# Patient Record
Sex: Male | Born: 1940 | Race: White | Hispanic: No | Marital: Married | Smoking: Never smoker
Health system: Southern US, Community
[De-identification: ages and names within clinical notes are randomized; demographics above are authoritative.]

## PROBLEM LIST (undated history)

## (undated) DIAGNOSIS — Z9889 Other specified postprocedural states: Secondary | ICD-10-CM

## (undated) DIAGNOSIS — N189 Chronic kidney disease, unspecified: Secondary | ICD-10-CM

## (undated) DIAGNOSIS — R001 Bradycardia, unspecified: Secondary | ICD-10-CM

## (undated) DIAGNOSIS — I2510 Atherosclerotic heart disease of native coronary artery without angina pectoris: Secondary | ICD-10-CM

## (undated) DIAGNOSIS — I10 Essential (primary) hypertension: Secondary | ICD-10-CM

## (undated) DIAGNOSIS — E669 Obesity, unspecified: Secondary | ICD-10-CM

## (undated) DIAGNOSIS — E785 Hyperlipidemia, unspecified: Secondary | ICD-10-CM

## (undated) DIAGNOSIS — I219 Acute myocardial infarction, unspecified: Secondary | ICD-10-CM

## (undated) DIAGNOSIS — Z9861 Coronary angioplasty status: Secondary | ICD-10-CM

## (undated) DIAGNOSIS — N289 Disorder of kidney and ureter, unspecified: Secondary | ICD-10-CM

## (undated) HISTORY — DX: Essential (primary) hypertension: I10

## (undated) HISTORY — PX: POLYPECTOMY: SHX149

## (undated) HISTORY — DX: Hyperlipidemia, unspecified: E78.5

## (undated) HISTORY — DX: Other specified postprocedural states: Z98.890

## (undated) HISTORY — DX: Disorder of kidney and ureter, unspecified: N28.9

## (undated) HISTORY — PX: CARDIAC CATHETERIZATION: SHX172

## (undated) HISTORY — DX: Chronic kidney disease, unspecified: N18.9

## (undated) HISTORY — DX: Acute myocardial infarction, unspecified: I21.9

## (undated) HISTORY — DX: Obesity, unspecified: E66.9

## (undated) HISTORY — DX: Bradycardia, unspecified: R00.1

## (undated) HISTORY — PX: COLONOSCOPY: SHX174

---

## 2000-04-06 ENCOUNTER — Emergency Department (HOSPITAL_COMMUNITY): Admission: EM | Admit: 2000-04-06 | Discharge: 2000-04-06 | Payer: Self-pay | Admitting: *Deleted

## 2002-04-08 DIAGNOSIS — I2510 Atherosclerotic heart disease of native coronary artery without angina pectoris: Secondary | ICD-10-CM

## 2002-04-08 HISTORY — DX: Atherosclerotic heart disease of native coronary artery without angina pectoris: I25.10

## 2002-04-08 HISTORY — PX: CARDIAC CATHETERIZATION: SHX172

## 2002-08-02 ENCOUNTER — Inpatient Hospital Stay (HOSPITAL_COMMUNITY): Admission: AD | Admit: 2002-08-02 | Discharge: 2002-08-09 | Payer: Self-pay | Admitting: Internal Medicine

## 2002-08-03 ENCOUNTER — Encounter: Payer: Self-pay | Admitting: Cardiovascular Disease

## 2002-08-03 ENCOUNTER — Encounter: Payer: Self-pay | Admitting: Internal Medicine

## 2002-08-04 ENCOUNTER — Encounter: Payer: Self-pay | Admitting: Internal Medicine

## 2002-08-05 ENCOUNTER — Encounter: Payer: Self-pay | Admitting: Internal Medicine

## 2004-04-24 ENCOUNTER — Ambulatory Visit: Payer: Self-pay | Admitting: Internal Medicine

## 2004-05-01 ENCOUNTER — Ambulatory Visit: Payer: Self-pay | Admitting: Internal Medicine

## 2004-10-31 ENCOUNTER — Ambulatory Visit: Payer: Self-pay | Admitting: Internal Medicine

## 2004-11-16 ENCOUNTER — Ambulatory Visit: Payer: Self-pay | Admitting: Internal Medicine

## 2005-02-11 ENCOUNTER — Ambulatory Visit: Payer: Self-pay | Admitting: Internal Medicine

## 2005-04-16 ENCOUNTER — Ambulatory Visit: Payer: Self-pay | Admitting: Internal Medicine

## 2005-04-23 ENCOUNTER — Ambulatory Visit: Payer: Self-pay | Admitting: Gastroenterology

## 2005-05-15 ENCOUNTER — Ambulatory Visit: Payer: Self-pay | Admitting: Gastroenterology

## 2005-05-15 ENCOUNTER — Encounter (INDEPENDENT_AMBULATORY_CARE_PROVIDER_SITE_OTHER): Payer: Self-pay | Admitting: Specialist

## 2005-05-15 DIAGNOSIS — Z9889 Other specified postprocedural states: Secondary | ICD-10-CM

## 2005-05-15 HISTORY — DX: Other specified postprocedural states: Z98.890

## 2005-05-15 LAB — HM COLONOSCOPY

## 2005-06-13 ENCOUNTER — Ambulatory Visit: Payer: Self-pay | Admitting: Internal Medicine

## 2005-08-14 ENCOUNTER — Ambulatory Visit: Payer: Self-pay | Admitting: Internal Medicine

## 2005-12-19 ENCOUNTER — Ambulatory Visit: Payer: Self-pay | Admitting: Internal Medicine

## 2005-12-24 ENCOUNTER — Ambulatory Visit: Payer: Self-pay | Admitting: Cardiology

## 2006-01-22 ENCOUNTER — Ambulatory Visit: Payer: Self-pay | Admitting: Internal Medicine

## 2006-03-20 ENCOUNTER — Ambulatory Visit: Payer: Self-pay | Admitting: Internal Medicine

## 2006-06-18 ENCOUNTER — Ambulatory Visit: Payer: Self-pay | Admitting: Internal Medicine

## 2006-06-18 LAB — CONVERTED CEMR LAB
ALT: 24 units/L (ref 0–40)
AST: 23 units/L (ref 0–37)
Albumin: 3.7 g/dL (ref 3.5–5.2)
Alkaline Phosphatase: 89 units/L (ref 39–117)
BUN: 22 mg/dL (ref 6–23)
Bilirubin, Direct: 0.2 mg/dL (ref 0.0–0.3)
CO2: 29 meq/L (ref 19–32)
Calcium: 9.2 mg/dL (ref 8.4–10.5)
Chloride: 105 meq/L (ref 96–112)
Cholesterol: 119 mg/dL (ref 0–200)
Creatinine, Ser: 1.7 mg/dL — ABNORMAL HIGH (ref 0.4–1.5)
GFR calc Af Amer: 52 mL/min
GFR calc non Af Amer: 43 mL/min
Glucose, Bld: 94 mg/dL (ref 70–99)
HDL: 27.4 mg/dL — ABNORMAL LOW (ref >39.0)
INR: 1 (ref 0.9–2.0)
LDL Cholesterol: 52 mg/dL (ref 0–99)
Potassium: 3.8 meq/L (ref 3.5–5.1)
Prothrombin Time: 12.6 s (ref 10.0–14.0)
Sodium: 143 meq/L (ref 135–145)
Total Bilirubin: 0.9 mg/dL (ref 0.3–1.2)
Total CHOL/HDL Ratio: 4.3
Total Protein: 7 g/dL (ref 6.0–8.3)
Triglycerides: 200 mg/dL — ABNORMAL HIGH (ref 0–149)
VLDL: 40 mg/dL (ref 0–40)
aPTT: 31.6 s (ref 26.5–36.5)

## 2006-10-06 ENCOUNTER — Ambulatory Visit: Payer: Self-pay | Admitting: Internal Medicine

## 2006-10-06 LAB — CONVERTED CEMR LAB
ALT: 22 units/L (ref 0–53)
AST: 19 units/L (ref 0–37)
Albumin: 4 g/dL (ref 3.5–5.2)
Alkaline Phosphatase: 90 units/L (ref 39–117)
BUN: 20 mg/dL (ref 6–23)
Bilirubin, Direct: 0.1 mg/dL (ref 0.0–0.3)
CO2: 25 meq/L (ref 19–32)
Calcium: 9.6 mg/dL (ref 8.4–10.5)
Chloride: 113 meq/L — ABNORMAL HIGH (ref 96–112)
Cholesterol: 102 mg/dL (ref 0–200)
Creatinine, Ser: 1.6 mg/dL — ABNORMAL HIGH (ref 0.4–1.5)
GFR calc Af Amer: 56 mL/min
GFR calc non Af Amer: 46 mL/min
Glucose, Bld: 98 mg/dL (ref 70–99)
HDL: 21.4 mg/dL — ABNORMAL LOW (ref >39.0)
LDL Cholesterol: 52 mg/dL (ref 0–99)
Potassium: 4.3 meq/L (ref 3.5–5.1)
Sodium: 143 meq/L (ref 135–145)
Total Bilirubin: 0.9 mg/dL (ref 0.3–1.2)
Total CHOL/HDL Ratio: 4.8
Total Protein: 7.4 g/dL (ref 6.0–8.3)
Triglycerides: 143 mg/dL (ref 0–149)
VLDL: 29 mg/dL (ref 0–40)

## 2006-10-28 DIAGNOSIS — E785 Hyperlipidemia, unspecified: Secondary | ICD-10-CM | POA: Insufficient documentation

## 2006-10-28 DIAGNOSIS — N259 Disorder resulting from impaired renal tubular function, unspecified: Secondary | ICD-10-CM | POA: Insufficient documentation

## 2006-10-28 DIAGNOSIS — I2510 Atherosclerotic heart disease of native coronary artery without angina pectoris: Secondary | ICD-10-CM

## 2006-10-28 DIAGNOSIS — I10 Essential (primary) hypertension: Secondary | ICD-10-CM

## 2007-01-01 ENCOUNTER — Ambulatory Visit: Payer: Self-pay | Admitting: Internal Medicine

## 2007-01-05 ENCOUNTER — Telehealth: Payer: Self-pay | Admitting: Internal Medicine

## 2007-02-03 ENCOUNTER — Ambulatory Visit: Payer: Self-pay | Admitting: Internal Medicine

## 2007-02-03 LAB — CONVERTED CEMR LAB
ALT: 32 units/L (ref 0–53)
AST: 23 units/L (ref 0–37)
Albumin: 4.2 g/dL (ref 3.5–5.2)
Alkaline Phosphatase: 90 units/L (ref 39–117)
BUN: 21 mg/dL (ref 6–23)
Bilirubin, Direct: 0.2 mg/dL (ref 0.0–0.3)
CO2: 26 meq/L (ref 19–32)
Calcium: 9.6 mg/dL (ref 8.4–10.5)
Chloride: 110 meq/L (ref 96–112)
Cholesterol: 120 mg/dL (ref 0–200)
Creatinine, Ser: 1.5 mg/dL (ref 0.4–1.5)
GFR calc Af Amer: 60 mL/min
GFR calc non Af Amer: 50 mL/min
Glucose, Bld: 95 mg/dL (ref 70–99)
HDL: 22.1 mg/dL — ABNORMAL LOW (ref >39.0)
LDL Cholesterol: 65 mg/dL (ref 0–99)
Potassium: 4.4 meq/L (ref 3.5–5.1)
Sodium: 141 meq/L (ref 135–145)
Total Bilirubin: 1.1 mg/dL (ref 0.3–1.2)
Total CHOL/HDL Ratio: 5.4
Total Protein: 7.2 g/dL (ref 6.0–8.3)
Triglycerides: 164 mg/dL — ABNORMAL HIGH (ref 0–149)
VLDL: 33 mg/dL (ref 0–40)

## 2007-06-03 ENCOUNTER — Ambulatory Visit: Payer: Self-pay | Admitting: Internal Medicine

## 2007-06-04 LAB — CONVERTED CEMR LAB
ALT: 35 units/L (ref 0–53)
Alkaline Phosphatase: 92 units/L (ref 39–117)
BUN: 20 mg/dL (ref 6–23)
Calcium: 9.8 mg/dL (ref 8.4–10.5)
GFR calc Af Amer: 46 mL/min
GFR calc non Af Amer: 38 mL/min
LDL Cholesterol: 63 mg/dL (ref 0–99)
Potassium: 4.7 meq/L (ref 3.5–5.1)
Total CHOL/HDL Ratio: 6
Total Protein: 6.8 g/dL (ref 6.0–8.3)
Triglycerides: 195 mg/dL — ABNORMAL HIGH (ref 0–149)
VLDL: 39 mg/dL (ref 0–40)

## 2007-10-05 ENCOUNTER — Ambulatory Visit: Payer: Self-pay | Admitting: Internal Medicine

## 2007-10-06 LAB — CONVERTED CEMR LAB
Albumin: 4 g/dL (ref 3.5–5.2)
Alkaline Phosphatase: 81 units/L (ref 39–117)
BUN: 22 mg/dL (ref 6–23)
Bilirubin, Direct: 0.1 mg/dL (ref 0.0–0.3)
Calcium: 9.1 mg/dL (ref 8.4–10.5)
GFR calc Af Amer: 60 mL/min
GFR calc non Af Amer: 50 mL/min
Glucose, Bld: 107 mg/dL — ABNORMAL HIGH (ref 70–99)
HDL: 23.2 mg/dL — ABNORMAL LOW (ref >39.0)
Potassium: 4.4 meq/L (ref 3.5–5.1)
Sodium: 141 meq/L (ref 135–145)
Total Protein: 7.4 g/dL (ref 6.0–8.3)
Triglycerides: 207 mg/dL (ref 0–149)
VLDL: 41 mg/dL — ABNORMAL HIGH (ref 0–40)

## 2007-12-16 ENCOUNTER — Ambulatory Visit: Payer: Self-pay | Admitting: Internal Medicine

## 2008-02-03 ENCOUNTER — Ambulatory Visit: Payer: Self-pay | Admitting: Internal Medicine

## 2008-05-20 ENCOUNTER — Telehealth: Payer: Self-pay | Admitting: Internal Medicine

## 2008-05-23 ENCOUNTER — Encounter: Payer: Self-pay | Admitting: Internal Medicine

## 2008-05-27 ENCOUNTER — Encounter: Payer: Self-pay | Admitting: Internal Medicine

## 2008-05-30 ENCOUNTER — Ambulatory Visit: Payer: Self-pay | Admitting: Internal Medicine

## 2008-06-01 LAB — CONVERTED CEMR LAB
ALT: 30 units/L (ref 0–53)
AST: 23 units/L (ref 0–37)
Albumin: 4 g/dL (ref 3.5–5.2)
BUN: 20 mg/dL (ref 6–23)
CO2: 27 meq/L (ref 19–32)
Calcium: 9.4 mg/dL (ref 8.4–10.5)
Chloride: 107 meq/L (ref 96–112)
Cholesterol: 115 mg/dL (ref 0–200)
Creatinine, Ser: 1.7 mg/dL — ABNORMAL HIGH (ref 0.4–1.5)
HDL: 26.3 mg/dL — ABNORMAL LOW (ref >39.0)
TSH: 5.24 microintl units/mL (ref 0.35–5.50)
Total CHOL/HDL Ratio: 4.4
Total Protein: 7 g/dL (ref 6.0–8.3)
Triglycerides: 175 mg/dL — ABNORMAL HIGH (ref 0–149)

## 2008-08-01 ENCOUNTER — Ambulatory Visit: Payer: Self-pay | Admitting: Internal Medicine

## 2008-09-13 ENCOUNTER — Ambulatory Visit: Payer: Self-pay | Admitting: Internal Medicine

## 2009-01-18 ENCOUNTER — Ambulatory Visit: Payer: Self-pay | Admitting: Internal Medicine

## 2009-01-18 LAB — CONVERTED CEMR LAB
ALT: 31 units/L (ref 0–53)
AST: 23 units/L (ref 0–37)
Alkaline Phosphatase: 72 units/L (ref 39–117)
BUN: 25 mg/dL — ABNORMAL HIGH (ref 6–23)
Bilirubin, Direct: 0 mg/dL (ref 0.0–0.3)
Cholesterol: 118 mg/dL (ref 0–200)
Creatinine, Ser: 1.7 mg/dL — ABNORMAL HIGH (ref 0.4–1.5)
GFR calc non Af Amer: 42.84 mL/min (ref >60)
Glucose, Bld: 89 mg/dL (ref 70–99)
Potassium: 4.4 meq/L (ref 3.5–5.1)
Total Bilirubin: 1.2 mg/dL (ref 0.3–1.2)
Total Protein: 7 g/dL (ref 6.0–8.3)
VLDL: 31.2 mg/dL (ref 0.0–40.0)

## 2009-01-25 ENCOUNTER — Ambulatory Visit: Payer: Self-pay | Admitting: Internal Medicine

## 2009-01-25 DIAGNOSIS — E669 Obesity, unspecified: Secondary | ICD-10-CM

## 2009-05-31 ENCOUNTER — Ambulatory Visit: Payer: Self-pay | Admitting: Internal Medicine

## 2009-05-31 LAB — CONVERTED CEMR LAB
ALT: 24 units/L (ref 0–53)
Alkaline Phosphatase: 71 units/L (ref 39–117)
BUN: 21 mg/dL (ref 6–23)
Basophils Absolute: 0 10*3/uL (ref 0.0–0.1)
Bilirubin, Direct: 0.1 mg/dL (ref 0.0–0.3)
CO2: 25 meq/L (ref 19–32)
Calcium: 9 mg/dL (ref 8.4–10.5)
Eosinophils Absolute: 0.5 10*3/uL (ref 0.0–0.7)
Glucose, Bld: 111 mg/dL — ABNORMAL HIGH (ref 70–99)
HCT: 44.3 % (ref 39.0–52.0)
Hemoglobin: 14.9 g/dL (ref 13.0–17.0)
Lymphs Abs: 1.5 10*3/uL (ref 0.7–4.0)
MCHC: 33.6 g/dL (ref 30.0–36.0)
Neutro Abs: 5 10*3/uL (ref 1.4–7.7)
Potassium: 4.2 meq/L (ref 3.5–5.1)
RDW: 12.6 % (ref 11.5–14.6)
Sodium: 140 meq/L (ref 135–145)
Total Bilirubin: 0.6 mg/dL (ref 0.3–1.2)
VLDL: 36 mg/dL (ref 0.0–40.0)

## 2009-07-20 ENCOUNTER — Ambulatory Visit: Payer: Self-pay | Admitting: Internal Medicine

## 2009-09-27 ENCOUNTER — Ambulatory Visit: Payer: Self-pay | Admitting: Internal Medicine

## 2010-01-01 ENCOUNTER — Ambulatory Visit: Payer: Self-pay | Admitting: Internal Medicine

## 2010-01-04 ENCOUNTER — Telehealth: Payer: Self-pay | Admitting: Internal Medicine

## 2010-04-16 ENCOUNTER — Ambulatory Visit
Admission: RE | Admit: 2010-04-16 | Discharge: 2010-04-16 | Payer: Self-pay | Source: Home / Self Care | Attending: Internal Medicine | Admitting: Internal Medicine

## 2010-04-16 ENCOUNTER — Other Ambulatory Visit: Payer: Self-pay | Admitting: Internal Medicine

## 2010-04-16 LAB — HEPATIC FUNCTION PANEL
ALT: 22 U/L (ref 0–53)
AST: 19 U/L (ref 0–37)
Albumin: 3.8 g/dL (ref 3.5–5.2)
Alkaline Phosphatase: 80 U/L (ref 39–117)
Bilirubin, Direct: 0.1 mg/dL (ref 0.0–0.3)
Total Bilirubin: 0.7 mg/dL (ref 0.3–1.2)
Total Protein: 6.7 g/dL (ref 6.0–8.3)

## 2010-04-16 LAB — LIPID PANEL
Cholesterol: 132 mg/dL (ref 0–200)
HDL: 27.7 mg/dL — ABNORMAL LOW (ref >39.00)
LDL Cholesterol: 69 mg/dL (ref 0–99)
Total CHOL/HDL Ratio: 5
Triglycerides: 179 mg/dL — ABNORMAL HIGH (ref 0.0–149.0)
VLDL: 35.8 mg/dL (ref 0.0–40.0)

## 2010-04-16 LAB — BASIC METABOLIC PANEL
BUN: 26 mg/dL — ABNORMAL HIGH (ref 6–23)
CO2: 25 mEq/L (ref 19–32)
Calcium: 9.4 mg/dL (ref 8.4–10.5)
Chloride: 109 mEq/L (ref 96–112)
Creatinine, Ser: 1.6 mg/dL — ABNORMAL HIGH (ref 0.4–1.5)
GFR: 45.13 mL/min — ABNORMAL LOW (ref >60.00)
Glucose, Bld: 97 mg/dL (ref 70–99)
Potassium: 4.6 mEq/L (ref 3.5–5.1)
Sodium: 142 mEq/L (ref 135–145)

## 2010-04-25 ENCOUNTER — Ambulatory Visit
Admission: RE | Admit: 2010-04-25 | Discharge: 2010-04-25 | Payer: Self-pay | Source: Home / Self Care | Attending: Internal Medicine | Admitting: Internal Medicine

## 2010-05-08 NOTE — Assessment & Plan Note (Signed)
Summary: 3 month f/u//alp   Vital Signs:  Patient profile:   70 year old male Weight:      240 pounds Temp:     98.4 degrees F oral Pulse rate:   72 / minute Pulse rhythm:   regular BP sitting:   144 / 86  Vitals Entered By: Lynann Beaver CMA (January 01, 2010 9:01 AM)  CC: rov Is Patient Diabetic? No Pain Assessment Patient in pain? no        Primary Care Dara Camargo:  Birdie Sons MD  CC:  rov.  History of Present Illness:  Follow-Up Visit      This is a 70 year old man who presents for Follow-up visit.  The patient denies chest pain and palpitations.  Since the last visit the patient notes no new problems or concerns.  The patient reports taking meds as prescribed.  When questioned about possible medication side effects, the patient notes none.    All other systems reviewed and were negative   Current Problems (verified): 1)  Obesity  (ICD-278.00) 2)  Hyperlipidemia  (ICD-272.4) 3)  Renal Insufficiency  (ICD-588.9) 4)  Hypertension  (ICD-401.9) 5)  Coronary Artery Disease  (ICD-414.00)  Current Medications (verified): 1)  Benicar 40 Mg  Tabs (Olmesartan Medoxomil) .Marland Kitchen.. 1 By Mouth Once Daily 2)  Ecotrin 325 Mg Tbec (Aspirin) .... Take 1 Tablet Once A Day 3)  Amlodipine Besylate 10 Mg Tabs (Amlodipine Besylate) .... Take 1 Tablet By Mouth Once A Day 4)  Simvastatin 40 Mg Tabs (Simvastatin) .... Take 1 Tablet By Mouth At Bedtime 5)  Niacin 250 Mg  Tabs (Niacin) .... 4 Daily 6)  Calcium 600/vitamin D 600-200 Mg-Unit  Tabs (Calcium Carbonate-Vitamin D) .... One Once Daily 7)  Fish Oil   Oil (Fish Oil) .... 1000mg  2 Daily 8)  Antioxidant Capsule .... Once Daily  Allergies (verified): 1)  ! Atenolol  Past History:  Past Medical History: Last updated: 07/20/2009 1. Coronary artery disease    --s/p MI    --s/p stents LCX and prox LAD 2004 2. Hypertension 3. Renal insufficiency 4. Hyperlipidemia 5. Obesity 6. Bradycardia requiring discontinuation of  b-blocker  Past Surgical History: Last updated: 10/28/2006 Colonoscopy-05/15/2005 Cardiac Cath  Social History: Last updated: 10/28/2006 Never Smoked Married Retired  Risk Factors: Smoking Status: never (09/27/2009)  Physical Exam  General:  alert and well-developed.   Head:  normocephalic and atraumatic.   Eyes:  pupils equal and pupils round.   Neck:  No deformities, masses, or tenderness noted. Chest Wall:  No deformities, masses, tenderness or gynecomastia noted. Lungs:  Normal respiratory effort, chest expands symmetrically. Lungs are clear to auscultation, no crackles or wheezes. Heart:  normal rate and regular rhythm.   Abdomen:  soft and non-tender.   Msk:  No deformity or scoliosis noted of thoracic or lumbar spine.   Neurologic:  cranial nerves II-XII intact and strength normal in all extremities.   Skin:  turgor normal and color normal.     Impression & Recommendations:  Problem # 1:  HYPERLIPIDEMIA (ICD-272.4) tolerating meds controlled continue current medications  His updated medication list for this problem includes:    Simvastatin 40 Mg Tabs (Simvastatin) .Marland Kitchen... Take 1 tablet by mouth at bedtime    Niacin 250 Mg Tabs (Niacin) .Marland KitchenMarland KitchenMarland KitchenMarland Kitchen 4 daily  Labs Reviewed: SGOT: 21 (05/31/2009)   SGPT: 24 (05/31/2009)   HDL:28.50 (05/31/2009), 25.20 (01/18/2009)  LDL:48 (05/31/2009), 62 (01/18/2009)  Chol:112 (05/31/2009), 118 (01/18/2009)  Trig:180.0 (05/31/2009), 156.0 (01/18/2009)  Problem #  2:  HYPERTENSION (ICD-401.9) tolerating meds continue current medications ---repeat BP ok His updated medication list for this problem includes:    Benicar 40 Mg Tabs (Olmesartan medoxomil) .Marland Kitchen... 1 by mouth once daily    Amlodipine Besylate 10 Mg Tabs (Amlodipine besylate) .Marland Kitchen... Take 1 tablet by mouth once a day  BP today: 144/86 Prior BP: 132/70 (09/27/2009)  Labs Reviewed: K+: 4.2 (05/31/2009) Creat: : 1.5 (05/31/2009)   Chol: 112 (05/31/2009)   HDL: 28.50 (05/31/2009)    LDL: 48 (05/31/2009)   TG: 180.0 (05/31/2009)  Problem # 3:  CORONARY ARTERY DISEASE (ICD-414.00) no sxs continue current medications  His updated medication list for this problem includes:    Benicar 40 Mg Tabs (Olmesartan medoxomil) .Marland Kitchen... 1 by mouth once daily    Ecotrin 325 Mg Tbec (Aspirin) .Marland Kitchen... Take 1 tablet once a day    Amlodipine Besylate 10 Mg Tabs (Amlodipine besylate) .Marland Kitchen... Take 1 tablet by mouth once a day  Labs Reviewed: Chol: 112 (05/31/2009)   HDL: 28.50 (05/31/2009)   LDL: 48 (05/31/2009)   TG: 180.0 (05/31/2009)  Complete Medication List: 1)  Benicar 40 Mg Tabs (Olmesartan medoxomil) .Marland Kitchen.. 1 by mouth once daily 2)  Ecotrin 325 Mg Tbec (Aspirin) .... Take 1 tablet once a day 3)  Amlodipine Besylate 10 Mg Tabs (Amlodipine besylate) .... Take 1 tablet by mouth once a day 4)  Simvastatin 40 Mg Tabs (Simvastatin) .... Take 1 tablet by mouth at bedtime 5)  Niacin 250 Mg Tabs (Niacin) .... 4 daily 6)  Calcium 600/vitamin D 600-200 Mg-unit Tabs (Calcium carbonate-vitamin d) .... One once daily 7)  Fish Oil Oil (Fish oil) .... 1000mg  2 daily 8)  Antioxidant Capsule  .... Once daily  Other Orders: Flu Vaccine 37yrs + MEDICARE PATIENTS (Q0347) Administration Flu vaccine - MCR (Q2595)  Patient Instructions: 1)  Please schedule a follow-up appointment in 4 months. 2)  lipids 272.4 3)  liver 995.2 4)  bmet--995.2 Flu Vaccine Consent Questions     Do you have a history of severe allergic reactions to this vaccine? no    Any prior history of allergic reactions to egg and/or gelatin? no    Do you have a sensitivity to the preservative Thimersol? no    Do you have a past history of Guillan-Barre Syndrome? no    Do you currently have an acute febrile illness? no    Have you ever had a severe reaction to latex? no    Vaccine information given and explained to patient? yes    Are you currently pregnant? no    Lot Number:[id number]   Exp Date:10/06/2010   Site Given  Left Deltoid  IMlu

## 2010-05-08 NOTE — Assessment & Plan Note (Signed)
Summary: f1y per check out/lg  Medications Added FISH OIL   OIL (FISH OIL) 1000mg  2 daily      Allergies Added:   Visit Type:  Follow-up Primary Provider:  Birdie Sons MD  CC:  no complaints.  History of Present Illness: Shaul is a very pleasant 70 year old male with a history of coronary artery disease status post lateral wall myocardial infarction in April 2004.  He underwent stenting of the circumflex and proximal LAD by Dr. Samule Ohm.  He also has a history of hypertension, hyperlipidemia, and obesity.  He returns today for his yearly followup.  Prevuiously had to stop his b-blocker due to bradycardia with HRs in the 40s.  Overall doing very well. No CP or SOB. Active around garden. Still not exercising although we have recommended walking program in past. Able to push mow his lawn. Weight stable. BP in general, well controlled. Toelrating medications without problem.  Last cholesterol panel 2/11; TC 112 TG 180  HDL 29 LDL 48. Not taking fish oil every day. Taking 1g niacin daily.   Current Medications (verified): 1)  Benicar 40 Mg  Tabs (Olmesartan Medoxomil) .Marland Kitchen.. 1 By Mouth Once Daily 2)  Ecotrin 325 Mg Tbec (Aspirin) .... Take 1 Tablet Once A Day 3)  Amlodipine Besylate 10 Mg Tabs (Amlodipine Besylate) .... Take 1 Tablet By Mouth Once A Day 4)  Plavix 75 Mg Tabs (Clopidogrel Bisulfate) .... Take 1 Tablet By Mouth Once A Day 5)  Simvastatin 40 Mg Tabs (Simvastatin) .... Take 1 Tablet By Mouth At Bedtime 6)  Niacin 250 Mg  Tabs (Niacin) .... 4 Daily 7)  Calcium 600/vitamin D 600-200 Mg-Unit  Tabs (Calcium Carbonate-Vitamin D) .... One Once Daily 8)  Fish Oil   Oil (Fish Oil) .... 1000mg  2 Daily  Allergies (verified): 1)  ! Atenolol  Past History:  Past Medical History: 1. Coronary artery disease    --s/p MI    --s/p stents LCX and prox LAD 2004 2. Hypertension 3. Renal insufficiency 4. Hyperlipidemia 5. Obesity 6. Bradycardia requiring discontinuation of  b-blocker  Review of Systems       As per HPI and past medical history; otherwise all systems negative.   Vital Signs:  Patient profile:   70 year old male Height:      72 inches Weight:      243 pounds BMI:     33.08 Pulse rate:   68 / minute BP sitting:   114 / 78  (left arm) Cuff size:   regular  Vitals Entered By: Hardin Negus, RMA (July 20, 2009 9:20 AM)  Physical Exam  General:  Gen: well appearing. no resp difficulty HEENT: normal Neck: supple. no JVD. Carotids 2+ bilat; no bruits. No lymphadenopathy or thryomegaly appreciated. Cor: PMI nondisplaced. Regular rate & rhythm. No rubs, gallops, murmur. Lungs: clear Abdomen: obese. soft, nontender, nondistended. No hepatosplenomegaly. Extremities: no cyanosis, clubbing, rash, edema Neuro: alert & orientedx3, cranial nerves grossly intact. moves all 4 extremities w/o difficulty. affect pleasant    Impression & Recommendations:  Problem # 1:  CORONARY ARTERY DISEASE (ICD-414.00) Stable. No evidence of ischemia. Continue current regimen. We discussed possibility of stopping Plavix but as it is going generic in November he would like to continue. Recommneded walking program 20-30 mins 4 to 5 times per week.  Problem # 2:  OBESITY (ICD-278.00) Start exercise program as above.  Problem # 3:  HYPERTENSION (ICD-401.9) Blood pressure well controlled. Continue current regimen.  Problem # 4:  HYPERLIPIDEMIA (ICD-272.4)  LDL looks great. HDL still low. Increase fish oil to 3g per day. Ecouraged weight loss.  Other Orders: EKG w/ Interpretation (93000)  Patient Instructions: 1)  Increase Fish Oil to 3 grams a day 2)  Your physician discussed the importance of regular exercise and recommended that you start or continue a regular exercise program for good health. 3)  Follow up in 1 year Prescriptions: PLAVIX 75 MG TABS (CLOPIDOGREL BISULFATE) Take 1 tablet by mouth once a day Brand medically necessary #8 x 0   Entered by:    Meredith Staggers, RN   Authorized by:   Dolores Patty, MD, [facility]   Signed by:   Meredith Staggers, RN on 07/20/2009   Method used:   Samples Given   RxID:   [id number]

## 2010-05-08 NOTE — Assessment & Plan Note (Signed)
Summary: 4 month rov/njr   Vital Signs:  Patient profile:   70 year old male Weight:      244 pounds BMI:     33.21 Temp:     98.2 degrees F oral Pulse rate:   70 / minute Resp:     12 per minute BP sitting:   128 / 76  (left arm) Cuff size:   regular  Vitals Entered By: Gladis Riffle, RN (May 31, 2009 9:16 AM) CC: f/u CAD, HTN Is Patient Diabetic? No   Primary Care Provider:  Birdie Sons MD  CC:  f/u CAD and HTN.  History of Present Illness:  Follow-Up Visit      This is a 70 year old man who presents for Follow-up visit.  The patient denies chest pain and palpitations.  Since the last visit the patient notes no new problems or concerns.  The patient reports taking meds as prescribed.  When questioned about possible medication side effects, the patient notes none.    All other systems reviewed and were negative   Preventive Screening-Counseling & Management  Alcohol-Tobacco     Smoking Status: never  Current Problems (verified): 1)  Obesity  (ICD-278.00) 2)  Hyperlipidemia  (ICD-272.4) 3)  Renal Insufficiency  (ICD-588.9) 4)  Hypertension  (ICD-401.9) 5)  Coronary Artery Disease  (ICD-414.00)  Medications Prior to Update: 1)  Benicar 40 Mg  Tabs (Olmesartan Medoxomil) .Marland Kitchen.. 1 By Mouth Once Daily 2)  Ecotrin 325 Mg Tbec (Aspirin) .... Take 1 Tablet Once A Day 3)  Amlodipine Besylate 10 Mg Tabs (Amlodipine Besylate) .... Take 1 Tablet By Mouth Once A Day 4)  Plavix 75 Mg Tabs (Clopidogrel Bisulfate) .... Take 1 Tablet By Mouth Once A Day 5)  Simvastatin 40 Mg Tabs (Simvastatin) .... Take 1 Tablet By Mouth At Bedtime 6)  Niacin 250 Mg  Tabs (Niacin) .... 4 Daily 7)  Calcium 600/vitamin D 600-200 Mg-Unit  Tabs (Calcium Carbonate-Vitamin D) .... One Once Daily  Current Medications (verified): 1)  Benicar 40 Mg  Tabs (Olmesartan Medoxomil) .Marland Kitchen.. 1 By Mouth Once Daily 2)  Ecotrin 325 Mg Tbec (Aspirin) .... Take 1 Tablet Once A Day 3)  Amlodipine Besylate 10 Mg Tabs  (Amlodipine Besylate) .... Take 1 Tablet By Mouth Once A Day 4)  Plavix 75 Mg Tabs (Clopidogrel Bisulfate) .... Take 1 Tablet By Mouth Once A Day 5)  Simvastatin 40 Mg Tabs (Simvastatin) .... Take 1 Tablet By Mouth At Bedtime 6)  Niacin 250 Mg  Tabs (Niacin) .... 4 Daily 7)  Calcium 600/vitamin D 600-200 Mg-Unit  Tabs (Calcium Carbonate-Vitamin D) .... One Once Daily  Allergies: 1)  ! Atenolol  Past History:  Past Medical History: Last updated: 07/31/2008 1. Coronary artery disease    --s/p MI    --s/p LCX and prox LAD 2004 2. Hypertension 3. Renal insufficiency 4. Hyperlipidemia 5. Obesity 6. Bradycardia requiring discontinuation of b-blocker  Past Surgical History: Last updated: 10/28/2006 Colonoscopy-05/15/2005 Cardiac Cath  Social History: Last updated: 10/28/2006 Never Smoked Married Retired  Risk Factors: Smoking Status: never (05/31/2009)  Review of Systems       All other systems reviewed and were negative   Physical Exam  General:  alert and well-developed.   Head:  normocephalic and atraumatic.   Eyes:  pupils equal and pupils round.   Ears:  R ear normal and L ear normal.   Neck:  No deformities, masses, or tenderness noted. Chest Wall:  No deformities, masses, tenderness or gynecomastia  noted. Lungs:  Normal respiratory effort, chest expands symmetrically. Lungs are clear to auscultation, no crackles or wheezes. Heart:  Normal rate and regular rhythm. S1 and S2 normal without gallop, murmur, click, rub or other extra sounds. Abdomen:  Bowel sounds positive,abdomen soft and non-tender without masses, organomegaly or hernias noted.  obese Msk:  No deformity or scoliosis noted of thoracic or lumbar spine.  no joint instability.   Pulses:  R radial normal and L radial normal.   Neurologic:  cranial nerves II-XII intact and gait normal.   Skin:  turgor normal and color normal.   Psych:  memory intact for recent and remote and good eye contact.      Impression & Recommendations:  Problem # 1:  CORONARY ARTERY DISEASE (ICD-414.00)  risk factor modification His updated medication list for this problem includes:    Benicar 40 Mg Tabs (Olmesartan medoxomil) .Marland Kitchen... 1 by mouth once daily    Ecotrin 325 Mg Tbec (Aspirin) .Marland Kitchen... Take 1 tablet once a day    Amlodipine Besylate 10 Mg Tabs (Amlodipine besylate) .Marland Kitchen... Take 1 tablet by mouth once a day    Plavix 75 Mg Tabs (Clopidogrel bisulfate) .Marland Kitchen... Take 1 tablet by mouth once a day  Labs Reviewed: Chol: 118 (01/18/2009)   HDL: 25.20 (01/18/2009)   LDL: 62 (01/18/2009)   TG: 156.0 (01/18/2009)  Orders: TLB-CBC Platelet - w/Differential (85025-CBCD)  Problem # 2:  HYPERTENSION (ICD-401.9) controlled continue current medications  His updated medication list for this problem includes:    Benicar 40 Mg Tabs (Olmesartan medoxomil) .Marland Kitchen... 1 by mouth once daily    Amlodipine Besylate 10 Mg Tabs (Amlodipine besylate) .Marland Kitchen... Take 1 tablet by mouth once a day  BP today: 128/76 Prior BP: 116/80 (09/13/2008)  Labs Reviewed: K+: 4.4 (01/18/2009) Creat: : 1.7 (01/18/2009)   Chol: 118 (01/18/2009)   HDL: 25.20 (01/18/2009)   LDL: 62 (01/18/2009)   TG: 156.0 (01/18/2009)  Problem # 3:  RENAL INSUFFICIENCY (ICD-588.9)  reviewed labs stable check labs---stable creatinine  Orders: TLB-BMP (Basic Metabolic Panel-BMET) (80048-METABOL)  Problem # 4:  HYPERLIPIDEMIA (ICD-272.4)  controlled continue current medications  check labs today advised daily exercise in hopes of increasing HDL His updated medication list for this problem includes:    Simvastatin 40 Mg Tabs (Simvastatin) .Marland Kitchen... Take 1 tablet by mouth at bedtime    Niacin 250 Mg Tabs (Niacin) .Marland KitchenMarland KitchenMarland KitchenMarland Kitchen 4 daily  Labs Reviewed: SGOT: 23 (01/18/2009)   SGPT: 31 (01/18/2009)   HDL:25.20 (01/18/2009), 26.3 (05/30/2008)  LDL:62 (01/18/2009), 54 (05/30/2008)  Chol:118 (01/18/2009), 115 (05/30/2008)  Trig:156.0 (01/18/2009), 175  (05/30/2008)  Orders: Venipuncture (16109) TLB-Lipid Panel (80061-LIPID) TLB-Hepatic/Liver Function Pnl (80076-HEPATIC) TLB-TSH (Thyroid Stimulating Hormone) (84443-TSH)  Complete Medication List: 1)  Benicar 40 Mg Tabs (Olmesartan medoxomil) .Marland Kitchen.. 1 by mouth once daily 2)  Ecotrin 325 Mg Tbec (Aspirin) .... Take 1 tablet once a day 3)  Amlodipine Besylate 10 Mg Tabs (Amlodipine besylate) .... Take 1 tablet by mouth once a day 4)  Plavix 75 Mg Tabs (Clopidogrel bisulfate) .... Take 1 tablet by mouth once a day 5)  Simvastatin 40 Mg Tabs (Simvastatin) .... Take 1 tablet by mouth at bedtime 6)  Niacin 250 Mg Tabs (Niacin) .... 4 daily 7)  Calcium 600/vitamin D 600-200 Mg-unit Tabs (Calcium carbonate-vitamin d) .... One once daily  Patient Instructions: 1)  Please schedule a follow-up appointment in 4 months.

## 2010-05-08 NOTE — Progress Notes (Signed)
Summary: Pt req Simvastatin and Amlodipine to [facility]  Phone Note Refill Request Call back at Home Phone [phone] Message from:  Patient  Refills Requested: Medication #1:  SIMVASTATIN 40 MG TABS Take 1 tablet by mouth at bedtime   Dosage confirmed as above?Dosage Confirmed  Medication #2:  AMLODIPINE BESYLATE 10 MG TABS Take 1 tablet by mouth once a day   Dosage confirmed as above?Dosage Confirmed Pls call these in to [facility] [phone]    Method Requested: Telephone to [facility] Initial call taken by: Lucy Antigua,  January 04, 2010 10:33 AM    Prescriptions: SIMVASTATIN 40 MG TABS (SIMVASTATIN) Take 1 tablet by mouth at bedtime  #90 Each x 3   Entered by:   Lynann Beaver CMA   Authorized by:   Birdie Sons MD   Signed by:   Lynann Beaver CMA on 01/04/2010   Method used:   Faxed to ...       [facility] (mail-order)       [address]       [place], [place]  [id number]       Ph: [phone]       Fax: [phone]   RxID:   [id number] AMLODIPINE BESYLATE 10 MG TABS (AMLODIPINE BESYLATE) Take 1 tablet by mouth once a day  #90 Each x 3   Entered by:   Lynann Beaver CMA   Authorized by:   Birdie Sons MD   Signed by:   Lynann Beaver CMA on 01/04/2010   Method used:   Faxed to ...       [facility] (mail-order)       [address]       [place], [place]  [id number]       Ph: [phone]       Fax: [phone]   RxID:   [id number]

## 2010-05-08 NOTE — Assessment & Plan Note (Signed)
Summary: 4 month rov/njr   Vital Signs:  Patient profile:   70 year old male Weight:      238 pounds BMI:     32.40 Temp:     97.7 degrees F oral Pulse rate:   72 / minute Pulse rhythm:   regular Resp:     12 per minute BP sitting:   132 / 70  (left arm) Cuff size:   regular  Vitals Entered By: Gladis Riffle, RN (September 27, 2009 9:24 AM) CC: 4 month rov--discuss plavix Is Patient Diabetic? No   Primary Care Provider:  Birdie Sons MD  CC:  4 month rov--discuss plavix.  History of Present Illness:  Follow-Up Visit      This is a 70 year old man who presents for Follow-up visit.  The patient denies chest pain and palpitations.  Since the last visit the patient notes no new problems or concerns.  The patient reports taking meds as prescribed.  When questioned about possible medication side effects, the patient notes none.    All other systems reviewed and were negative   Preventive Screening-Counseling & Management  Alcohol-Tobacco     Smoking Status: never  Current Problems (verified): 1)  Obesity  (ICD-278.00) 2)  Hyperlipidemia  (ICD-272.4) 3)  Renal Insufficiency  (ICD-588.9) 4)  Hypertension  (ICD-401.9) 5)  Coronary Artery Disease  (ICD-414.00)  Current Medications (verified): 1)  Benicar 40 Mg  Tabs (Olmesartan Medoxomil) .Marland Kitchen.. 1 By Mouth Once Daily 2)  Ecotrin 325 Mg Tbec (Aspirin) .... Take 1 Tablet Once A Day 3)  Amlodipine Besylate 10 Mg Tabs (Amlodipine Besylate) .... Take 1 Tablet By Mouth Once A Day 4)  Plavix 75 Mg Tabs (Clopidogrel Bisulfate) .... Take 1 Tablet By Mouth Once A Day 5)  Simvastatin 40 Mg Tabs (Simvastatin) .... Take 1 Tablet By Mouth At Bedtime 6)  Niacin 250 Mg  Tabs (Niacin) .... 4 Daily 7)  Calcium 600/vitamin D 600-200 Mg-Unit  Tabs (Calcium Carbonate-Vitamin D) .... One Once Daily 8)  Fish Oil   Oil (Fish Oil) .... 1000mg  2 Daily 9)  Antioxidant Capsule .... Once Daily  Allergies: 1)  ! Atenolol  Past History:  Past Medical  History: Last updated: 07/20/2009 1. Coronary artery disease    --s/p MI    --s/p stents LCX and prox LAD 2004 2. Hypertension 3. Renal insufficiency 4. Hyperlipidemia 5. Obesity 6. Bradycardia requiring discontinuation of b-blocker  Past Surgical History: Last updated: 10/28/2006 Colonoscopy-05/15/2005 Cardiac Cath  Social History: Last updated: 10/28/2006 Never Smoked Married Retired  Risk Factors: Smoking Status: never (09/27/2009)  Physical Exam  General:  alert and well-developed.   Head:  normocephalic and atraumatic.   Eyes:  pupils equal and pupils round.   Ears:  R ear normal and L ear normal.   Neck:  No deformities, masses, or tenderness noted. Chest Wall:  No deformities, masses, tenderness or gynecomastia noted. Lungs:  Normal respiratory effort, chest expands symmetrically. Lungs are clear to auscultation, no crackles or wheezes. Abdomen:  Bowel sounds positive,abdomen soft and non-tender without masses, organomegaly or hernias noted.  obese Msk:  No deformity or scoliosis noted of thoracic or lumbar spine.  no joint instability.   Pulses:  R radial normal and L radial normal.   Neurologic:  cranial nerves II-XII intact and gait normal.     Impression & Recommendations:  Problem # 1:  CORONARY ARTERY DISEASE (ICD-414.00)  has been told by dr benshimon that he could stop plavix---i'll remove from  list The following medications were removed from the medication list:    Plavix 75 Mg Tabs (Clopidogrel bisulfate) .Marland Kitchen... Take 1 tablet by mouth once a day His updated medication list for this problem includes:    Benicar 40 Mg Tabs (Olmesartan medoxomil) .Marland Kitchen... 1 by mouth once daily    Ecotrin 325 Mg Tbec (Aspirin) .Marland Kitchen... Take 1 tablet once a day    Amlodipine Besylate 10 Mg Tabs (Amlodipine besylate) .Marland Kitchen... Take 1 tablet by mouth once a day  Labs Reviewed: Chol: 112 (05/31/2009)   HDL: 28.50 (05/31/2009)   LDL: 48 (05/31/2009)   TG: 180.0  (05/31/2009)  Problem # 2:  HYPERLIPIDEMIA (ICD-272.4) controlled continue current medications  His updated medication list for this problem includes:    Simvastatin 40 Mg Tabs (Simvastatin) .Marland Kitchen... Take 1 tablet by mouth at bedtime    Niacin 250 Mg Tabs (Niacin) .Marland KitchenMarland KitchenMarland KitchenMarland Kitchen 4 daily  Labs Reviewed: SGOT: 21 (05/31/2009)   SGPT: 24 (05/31/2009)   HDL:28.50 (05/31/2009), 25.20 (01/18/2009)  LDL:48 (05/31/2009), 62 (01/18/2009)  Chol:112 (05/31/2009), 118 (01/18/2009)  Trig:180.0 (05/31/2009), 156.0 (01/18/2009)  Problem # 3:  HYPERTENSION (ICD-401.9) controlled continue current medications  His updated medication list for this problem includes:    Benicar 40 Mg Tabs (Olmesartan medoxomil) .Marland Kitchen... 1 by mouth once daily    Amlodipine Besylate 10 Mg Tabs (Amlodipine besylate) .Marland Kitchen... Take 1 tablet by mouth once a day  BP today: 132/70 Prior BP: 114/78 (07/20/2009)  Labs Reviewed: K+: 4.2 (05/31/2009) Creat: : 1.5 (05/31/2009)   Chol: 112 (05/31/2009)   HDL: 28.50 (05/31/2009)   LDL: 48 (05/31/2009)   TG: 180.0 (05/31/2009)  Problem # 4:  RENAL INSUFFICIENCY (ICD-588.9) check labs in 3 months  Complete Medication List: 1)  Benicar 40 Mg Tabs (Olmesartan medoxomil) .Marland Kitchen.. 1 by mouth once daily 2)  Ecotrin 325 Mg Tbec (Aspirin) .... Take 1 tablet once a day 3)  Amlodipine Besylate 10 Mg Tabs (Amlodipine besylate) .... Take 1 tablet by mouth once a day 4)  Simvastatin 40 Mg Tabs (Simvastatin) .... Take 1 tablet by mouth at bedtime 5)  Niacin 250 Mg Tabs (Niacin) .... 4 daily 6)  Calcium 600/vitamin D 600-200 Mg-unit Tabs (Calcium carbonate-vitamin d) .... One once daily 7)  Fish Oil Oil (Fish oil) .... 1000mg  2 daily 8)  Antioxidant Capsule  .... Once daily  Patient Instructions: 1)  Please schedule a follow-up appointment in 3 months. 2)  lipids 272.4 3)  liver 995.2 4)  bmet---995.2

## 2010-05-10 NOTE — Assessment & Plan Note (Signed)
Summary: 4 month rov/njr   Vital Signs:  Patient profile:   70 year old male Weight:      245 pounds Temp:     97.8 degrees F oral Pulse rate:   68 / minute Pulse rhythm:   regular BP sitting:   124 / 84  (left arm) Cuff size:   large  Vitals Entered By: Alfred Levins, CMA (April 25, 2010 8:46 AM) CC: f/u   Primary Care Provider:  Birdie Sons MD  CC:  f/u.  History of Present Illness:  Follow-Up Visit      This is a 70 year old man who presents for Follow-up visit.  The patient denies chest pain and palpitations.  Since the last visit the patient notes no new problems or concerns.  The patient reports taking meds as prescribed.  When questioned about possible medication side effects, the patient notes none.    All other systems reviewed and were negative   Current Problems (verified): 1)  Obesity  (ICD-278.00) 2)  Hyperlipidemia  (ICD-272.4) 3)  Renal Insufficiency  (ICD-588.9) 4)  Hypertension  (ICD-401.9) 5)  Coronary Artery Disease  (ICD-414.00)  Current Medications (verified): 1)  Benicar 40 Mg  Tabs (Olmesartan Medoxomil) .Marland Kitchen.. 1 By Mouth Once Daily 2)  Ecotrin 325 Mg Tbec (Aspirin) .... Take 1 Tablet Once A Day 3)  Amlodipine Besylate 10 Mg Tabs (Amlodipine Besylate) .... Take 1 Tablet By Mouth Once A Day 4)  Simvastatin 40 Mg Tabs (Simvastatin) .... Take 1 Tablet By Mouth At Bedtime 5)  Niacin 250 Mg  Tabs (Niacin) .... 4 Daily 6)  Calcium 600/vitamin D 600-200 Mg-Unit  Tabs (Calcium Carbonate-Vitamin D) .... One Once Daily 7)  Fish Oil   Oil (Fish Oil) .... 1000mg  2 Daily 8)  Ocuvite   Tabs (Multiple Vitamins-Minerals) .... Take 2 Tab By Mouth Daily  Allergies (verified): 1)  ! Atenolol  Past History:  Past Medical History: Last updated: 07/20/2009 1. Coronary artery disease    --s/p MI    --s/p stents LCX and prox LAD 2004 2. Hypertension 3. Renal insufficiency 4. Hyperlipidemia 5. Obesity 6. Bradycardia requiring discontinuation of  b-blocker  Past Surgical History: Last updated: 10/28/2006 Colonoscopy-05/15/2005 Cardiac Cath  Social History: Last updated: 10/28/2006 Never Smoked Married Retired  Risk Factors: Smoking Status: never (09/27/2009)  Physical Exam  General:   well-developed well-nourished male in no acute distress. HEENT exam atraumatic, normocephalic, extraocular muscles are intact. Neck is supple. Chest clear to auscultation cardiac exam S1-S2 are regular. Abdominal exam active bowel sounds, soft, nontender. extremities no edema. Neurologic exam alert   Impression & Recommendations:  Problem # 1:  CORONARY ARTERY DISEASE (ICD-414.00)  no sxs continue current medications  His updated medication list for this problem includes:    Benicar 40 Mg Tabs (Olmesartan medoxomil) .Marland Kitchen... 1 by mouth once daily    Ecotrin 325 Mg Tbec (Aspirin) .Marland Kitchen... Take 1 tablet once a day    Amlodipine Besylate 10 Mg Tabs (Amlodipine besylate) .Marland Kitchen... Take 1 tablet by mouth once a day  Labs Reviewed: Chol: 132 (04/16/2010)   HDL: 27.70 (04/16/2010)   LDL: 69 (04/16/2010)   TG: 179.0 (04/16/2010)  Problem # 2:  HYPERLIPIDEMIA (ICD-272.4) low HDL he should addess with diet/exercise and weight  loss His updated medication list for this problem includes:    Simvastatin 40 Mg Tabs (Simvastatin) .Marland Kitchen... Take 1 tablet by mouth at bedtime    Niacin 250 Mg Tabs (Niacin) .Marland KitchenMarland KitchenMarland KitchenMarland Kitchen 4 daily  Labs  Reviewed: SGOT: 19 (04/16/2010)   SGPT: 22 (04/16/2010)   HDL:27.70 (04/16/2010), 28.50 (05/31/2009)  LDL:69 (04/16/2010), 48 (05/31/2009)  Chol:132 (04/16/2010), 112 (05/31/2009)  Trig:179.0 (04/16/2010), 180.0 (05/31/2009)  Problem # 3:  HYPERTENSION (ICD-401.9) controlled continue current medications  His updated medication list for this problem includes:    Benicar 40 Mg Tabs (Olmesartan medoxomil) .Marland Kitchen... 1 by mouth once daily    Amlodipine Besylate 10 Mg Tabs (Amlodipine besylate) .Marland Kitchen... Take 1 tablet by mouth once a day  BP today:  124/84 Prior BP: 144/86 (01/01/2010)  Labs Reviewed: K+: 4.6 (04/16/2010) Creat: : 1.6 (04/16/2010)   Chol: 132 (04/16/2010)   HDL: 27.70 (04/16/2010)   LDL: 69 (04/16/2010)   TG: 179.0 (04/16/2010)  Problem # 4:  RENAL INSUFFICIENCY (ICD-588.9) reviewed labs  Complete Medication List: 1)  Benicar 40 Mg Tabs (Olmesartan medoxomil) .Marland Kitchen.. 1 by mouth once daily 2)  Ecotrin 325 Mg Tbec (Aspirin) .... Take 1 tablet once a day 3)  Amlodipine Besylate 10 Mg Tabs (Amlodipine besylate) .... Take 1 tablet by mouth once a day 4)  Simvastatin 40 Mg Tabs (Simvastatin) .... Take 1 tablet by mouth at bedtime 5)  Niacin 250 Mg Tabs (Niacin) .... 4 daily 6)  Calcium 600/vitamin D 600-200 Mg-unit Tabs (Calcium carbonate-vitamin d) .... One once daily 7)  Fish Oil Oil (Fish oil) .... 1000mg  2 daily 8)  Ocuvite Tabs (Multiple vitamins-minerals) .... Take 2 tab by mouth daily  Patient Instructions: 1)  Please schedule a follow-up appointment in 3 months.   Orders Added: 1)  Est. Patient Level IV [81191]

## 2010-06-28 ENCOUNTER — Encounter: Payer: Self-pay | Admitting: Gastroenterology

## 2010-07-05 NOTE — Letter (Signed)
Summary: Colonoscopy Letter  [facility]  [address] [place], [place] [address]   Phone: [phone]  Fax: [phone]      June 28, 2010 MRN: [id number]   Glen [name] [address] [place], [place]  [address]   Dear [name],   According to your medical record, it is time for you to schedule a Colonoscopy. The American Cancer Society recommends this procedure as a method to detect early colon cancer. Patients with a family history of colon cancer, or a personal history of colon polyps or inflammatory bowel disease are at increased risk.  This letter has been generated based on the recommendations made at the time of your procedure. If you feel that in your particular situation this may no longer apply, please contact our office.  Please call our office at [phone] to schedule this appointment or to update your records at your earliest convenience.  Thank you for cooperating with [place] to provide you with the very best care possible.   Sincerely,  Rachael Fee, M.D.  [facility] [phone]

## 2010-07-24 ENCOUNTER — Encounter: Payer: Self-pay | Admitting: Internal Medicine

## 2010-07-26 ENCOUNTER — Encounter: Payer: Self-pay | Admitting: Internal Medicine

## 2010-07-26 ENCOUNTER — Ambulatory Visit (INDEPENDENT_AMBULATORY_CARE_PROVIDER_SITE_OTHER): Payer: 59 | Admitting: Internal Medicine

## 2010-07-26 DIAGNOSIS — N259 Disorder resulting from impaired renal tubular function, unspecified: Secondary | ICD-10-CM

## 2010-07-26 DIAGNOSIS — I2510 Atherosclerotic heart disease of native coronary artery without angina pectoris: Secondary | ICD-10-CM

## 2010-07-26 DIAGNOSIS — E785 Hyperlipidemia, unspecified: Secondary | ICD-10-CM

## 2010-07-26 NOTE — Assessment & Plan Note (Signed)
Will need continued follow up

## 2010-07-26 NOTE — Assessment & Plan Note (Signed)
Tolerating meds Reviewed recent labs F/u 3 months

## 2010-07-26 NOTE — Progress Notes (Signed)
  Subjective:    Patient ID: Heather Roberts, male    DOB: [birth year], 70 y.o.   MRN: [id number]  HPI  F/u HTN---tolerating meds  Renal Insuff: needs followup  CAD: no CP, SOB , PND, orhopnea  Past Medical History  Diagnosis Date  . CAD (coronary artery disease) 2004    s/p MI, s/p stents LCX and prox LAD   . Hypertension   . Chronic kidney disease     renal failure  . Hyperlipidemia    Past Surgical History  Procedure Date  . Cardiac catheterization     reports that he has never smoked. He does not have any smokeless tobacco history on file. His alcohol and drug histories not on file. family history includes Alcohol abuse in his brother; Aneurysm in his father; Heart attack in his brother; and Hypertension in his father. Allergies  Allergen Reactions  . Atenolol     REACTION: bradycardia     Review of Systems  patient denies chest pain, shortness of breath, orthopnea. Denies lower extremity edema, abdominal pain, change in appetite, change in bowel movements. Patient denies rashes, musculoskeletal complaints. No other specific complaints in a complete review of systems.      Objective:   Physical Exam  well-developed well-nourished male in no acute distress. HEENT exam atraumatic, normocephalic, neck supple without jugular venous distention. Chest clear to auscultation cardiac exam S1-S2 are regular. Abdominal exam overweight with bowel sounds, soft and nontender. Extremities no edema. Neurologic exam is alert with a normal gait.        Assessment & Plan:

## 2010-07-26 NOTE — Assessment & Plan Note (Signed)
No sxs Continue risk factor modification 

## 2010-08-02 ENCOUNTER — Encounter: Payer: Self-pay | Admitting: Internal Medicine

## 2010-08-03 ENCOUNTER — Ambulatory Visit (INDEPENDENT_AMBULATORY_CARE_PROVIDER_SITE_OTHER): Payer: 59 | Admitting: Internal Medicine

## 2010-08-03 ENCOUNTER — Encounter: Payer: Self-pay | Admitting: Internal Medicine

## 2010-08-03 VITALS — BP 122/82 | HR 55 | Resp 18 | Ht 72.0 in | Wt 238.4 lb

## 2010-08-03 DIAGNOSIS — E785 Hyperlipidemia, unspecified: Secondary | ICD-10-CM

## 2010-08-03 DIAGNOSIS — I10 Essential (primary) hypertension: Secondary | ICD-10-CM

## 2010-08-03 DIAGNOSIS — I2510 Atherosclerotic heart disease of native coronary artery without angina pectoris: Secondary | ICD-10-CM

## 2010-08-03 MED ORDER — OMEGA-3 FATTY ACIDS 1000 MG PO CAPS: ORAL_CAPSULE | ORAL | Status: DC

## 2010-08-03 MED ORDER — ASPIRIN 81 MG PO TABS: ORAL_TABLET | ORAL | Status: AC

## 2010-08-03 NOTE — Progress Notes (Signed)
HPI:  Glen [name] is a very pleasant 70 year old male with a history of coronary artery disease status post lateral wall myocardial infarction in April 2004.  He underwent stenting of the circumflex and proximal LAD by Dr. Samule Ohm.  He also has a history of hypertension, hyperlipidemia, and obesity.  He returns today for his yearly followup.  Prevuiously had to stop his b-blocker due to bradycardia with HRs in the 40s.  Overall doing very well. No CP or SOB. Active around garden. Still not exercising although we have recommended walking program in past. Weight stable. BP very well controlled. Toelrating medications without problem.  Recent lipid TC 132 TG 179 HDL 28 LDL 69  ROS: All systems negative except as listed in HPI, PMH and Problem List.  Past Medical History  Diagnosis Date  . CAD (coronary artery disease) 2004    s/p MI, s/p stents LCX and prox LAD   . Hypertension   . Chronic kidney disease     renal failure  . Hyperlipidemia   . Hx of colonoscopy 05/15/2005  . Obesity   . Bradycardia     requiring discontinuation of b-blocker  . Renal insufficiency     Current Outpatient Prescriptions  Medication Sig Dispense Refill  . amLODipine (NORVASC) 10 MG tablet Take 10 mg by mouth daily.        Marland Kitchen aspirin 325 MG tablet Take 325 mg by mouth daily.        . Calcium Carbonate-Vit D-Min (CALCIUM 600 + MINERALS) 600-200 MG-UNIT TABS Take by mouth.        . fish oil-omega-3 fatty acids 1000 MG capsule Take 2 g by mouth daily.        . Multiple Vitamins-Minerals (OCUVITE PO) Take 1 tablet by mouth daily.        . niacin 250 MG tablet Take 500 mg by mouth 2 (two) times daily with a meal.       . olmesartan (BENICAR) 40 MG tablet Take 40 mg by mouth daily.        . simvastatin (ZOCOR) 40 MG tablet Take 40 mg by mouth at bedtime.           PHYSICAL EXAM: Filed Vitals:   08/03/10 1048  BP: 122/82  Pulse: 55  Resp: 18   Gen: well appearing. no resp difficulty HEENT: normal Neck:  supple. no JVD. Carotids 2+ bilat; no bruits. No lymphadenopathy or thryomegaly appreciated. Cor: PMI nondisplaced. Regular rate & rhythm. No rubs, gallops, murmur. Lungs: clear Abdomen: obese. soft, nontender, nondistended. No hepatosplenomegaly. Extremities: no cyanosis, clubbing, rash, edema Neuro: alert & orientedx3, cranial nerves grossly intact. moves all 4 extremities w/o difficulty. affect pleasant    ECG: Sinus bradycardia 55 IVCD ( ) No ST-T wave abnormalities.     ASSESSMENT & PLAN:

## 2010-08-03 NOTE — Assessment & Plan Note (Signed)
LDL at goal (<70). But HDL remains low. Discussed results of AIM-HIGH study of niacin (showing no mobidity/mortality benefits) but given very low HDL will continue. Also increase fish oil to 4g/day. F/u with Dr. Cato Mulligan.

## 2010-08-03 NOTE — Assessment & Plan Note (Signed)
No evidence of ischemia. Continue current regimen.   

## 2010-08-03 NOTE — Patient Instructions (Signed)
Decrease Aspirin to 81mg  daily Increase Fish Oil to 4 grams daily Your physician wants you to follow-up in: 1 year. You will receive a reminder letter in the mail two months in advance. If you don't receive a letter, please call our office to schedule the follow-up appointment.

## 2010-08-03 NOTE — Assessment & Plan Note (Addendum)
No evidence of ischemia. Continue current regimen except decrease ASA 81mg . Encouraged to walk regularly.

## 2010-08-21 NOTE — Assessment & Plan Note (Signed)
[facility]                            CARDIOLOGY OFFICE NOTE   SHANTANU, STRAUCH                   MRN:          [id number]  DATE:12/16/2007                            DOB:          01-02-1941    PRIMARY CARE PHYSICIAN:  Valetta Mole. Swords, MD   INTERVAL HISTORY:  Kaveon is a very pleasant 70 year old male with a  history of coronary artery disease status post lateral wall myocardial  infarction in April 2004.  He underwent stenting of the circumflex and  proximal LAD by Dr. Samule Ohm.  He also has a history of hypertension,  hyperlipidemia, and obesity.  He returns today for his yearly followup.   Overall, he is doing very well.  He has been active around the yard and  house, but not exercising per se.  He denies any chest pain or shortness  of breath.  His wife is worried he is eating too much ice cream and  gaining too much weight.  He has in fact gained about 10 pounds.  He  denies any lightheadedness or syncope.  Remainder of the review of  systems is negative except for HPI and problem list.   CURRENT MEDICATIONS:  1. Aspirin 325 a day.  2. Zocor 40 a day.  3. Plavix 75 a day.  4. Norvasc 10 a day.  5. Avapro 300 a day.  6. Atenolol 25 a day.  7. Calcium.  8. Niacin 500 mg a day.   PHYSICAL EXAMINATION:  GENERAL:  He is an obese male, ambulates around  the clinic without any respiratory difficulty.  VITALS:  Blood pressure is 117/80, heart rate is 45, weight is 251 which  is up 10 pounds.  HEENT:  Normal.  NECK:  Supple.  No JVD.  Carotids are 2+ bilaterally without bruits.  There is no lymphadenopathy or thyromegaly.  CARDIAC:  PMI is nondisplaced.  He is bradycardic and regular.  No  murmurs, rubs or gallops.  LUNGS:  Clear.  ABDOMEN:  Obese, nontender, nondistended.  No hepatosplenomegaly.  No  bruits, no masses.  Good bowel sounds.  EXTREMITIES:  Warm with no  cyanosis, clubbing or edema.  Good pulses.  NEURO:  He is alert  and oriented x3.  Cranial nerves II through XII are  intact.  Moves all four extremities without difficulty.  Affect is  pleasant.   EKG shows sinus bradycardia at a rate of 45, QRS duration is 108  milliseconds.   ASSESSMENT AND PLAN:  1. Coronary artery disease, he is doing well.  No evidence of      ischemia.  Continue current therapy.  2. Hypertension, well controlled.  3. Bradycardia, this is quite significant.  He is asymptomatic.  I      suspect at some point he may need a pacemaker; however, not      currently as he is asymptomatic.  We will stop his atenolol, we      will see him back in 6 months.  4. Obesity, I told him that he needs to lose try at least 30 pounds.      I  gave him the goal about a 1/2 pound a week.  I have asked him to      start exercising more regularly, building upon a walking program up      to 40 minutes a day.  5. Hyperlipidemia, followed by Dr. Cato Mulligan, goal LDL is less than 70.     [name], MD  Electronically Signed    DRB/MedQ  DD: 12/16/2007  DT: 12/17/2007  Job #: [id number]

## 2010-08-21 NOTE — Assessment & Plan Note (Signed)
[facility]                            CARDIOLOGY OFFICE NOTE   Glen [name], Glen [name]                   MRN:          [id number]  DATE:01/01/2007                            DOB:          [birth year]    PRIMARY CARE PHYSICIAN:  Glen [name].   INTERVAL HISTORY:  Glen [name] is a very pleasant 70 year old male with  a history of coronary artery disease status post lateral myocardial  infarction in April 2004.  He underwent stenting of the circumflex and  proximal LAD by Glen [name].  He also has a history of hypertension,  hyperlipidemia, and obesity.  He was previously followed by Glen [name],  and returns today to establish longterm care.   Overall, he says he is doing very well.  He denies any chest pain or  shortness of breath.  He is active mowing the grass and going to the  store, and but he does not exercise on a regular basis.  He has not had  any chest pain or shortness of breath.  He has been compliant with all  of his medications.   CURRENT MEDICATIONS:  1. Aspirin 325 a day.  2. Zocor 40 a day.  3. Plavix 75 a day.  4. Norvasc 10 a day.  5. Avapro 300 a day.  6. Atenolol 25 a day.  7. Calcium.  8. Niacin 500 a day.   PHYSICAL EXAMINATION:  He is an obese male who ambulates around the  clinic without any respiratory difficulty.  In no acute distress.  Blood pressure is 118/76.  Heart rate is 59.  Weight is 241.  HEENT:  Normal.  NECK:  Supple.  No obvious JVD, but it is hard to tell given his neck  size.  Carotids are 2+ bilaterally without bruits.  There is no  lymphadenopathy or thyromegaly.  CARDIAC:  PMI is non-displaced.  Regular rate and rhythm.  No murmurs,  rubs, or gallops.  LUNGS:  Clear.  ABDOMEN:  Obese.  Nontender.  Non-distended.  No hepatosplenomegaly.  No  bruits.  No masses appreciated.  Good bowel sounds.  EXTREMITIES:  Warm with no cyanosis, clubbing, or edema.  Good pulses.  No rash.  NEUROLOGIC:  Alert  and oriented x3.  Cranial nerves 2 through 12 are  intact.  Moves all 4 extremities without edema.  Affect is very  pleasant.  EKG:  Shows sinus bradycardia at a rate of 55 with no ST-T wave  abnormalities.   ASSESSMENT AND PLAN:  1. Coronary artery disease status post previous myocardial infarction      and stenting of the circumflex and left anterior descending.  He is      doing well without any evidence of ischemia.  He will continue      current therapy.  2. Hypertension.  Well controlled.  3. Hyperlipidemia.  Followed by Glen [name].  Goal LDL is less than 70.  4. Obesity.  I did have a long talk with both him and wife about the      need for weight loss.  He does have some snoring, but does  not seem      to have overt sleep apnea.  I have also instructed him to be more      vigilant about watching his diet and getting regular exercise by      walking 4 to 5 times a week for 40 minutes.   DISPOSITION:  He will return to clinic in 1 year.  Follow up with Dr.  Cato [name] in the interim.     Glen Buckles. Bensimhon, MD  Electronically Signed    DRB/MedQ  DD: 01/01/2007  DT: 01/02/2007  Job #: [id number]   cc:   Glen Mole. Swords, MD

## 2010-08-24 NOTE — Discharge Summary (Signed)
NAME:  Glen [name], Glen [name]                      ACCOUNT NO.:  [id number]   MEDICAL RECORD NO.:  [id number]                   PATIENT TYPE:  INP   LOCATION:  2906                                 FACILITY:  [facility]   PHYSICIAN:  Salvadore Farber, M.D.             DATE OF BIRTH:  1940-06-15   DATE OF ADMISSION:  08/02/2002  DATE OF DISCHARGE:  08/09/2002                           DISCHARGE SUMMARY - REFERRING   PROCEDURES:  1. Cardiac catheterization.  2. Coronary arteriogram.  3. Left ventriculogram.  4. Percutaneous transluminal coronary angioplasty and Taxus stent to the     left anterior descending.  5. Percutaneous transluminal coronary angioplasty and Taxus stent to the     obtuse marginal 1.  6. Intubation.  7. Mechanical ventilation greater than 24 hours.  8. Echocardiogram.   HOSPITAL COURSE:  The patient is a 70 year old male with no known history of  coronary artery disease.  He has not had medical care or medical evaluation  in several years.  His wife is a patient of Dr. Cato Mulligan, and he went to see  Dr. Cato Mulligan on August 02, 2002.  He was evaluated by Dr. Cato Mulligan for substernal  chest pain and pressure, and Dr. Cato Mulligan admitted him to the hospital for  further evaluation and treatment.  An additional problem was malignant  hypertension with a blood pressure in the office of 220/180.   The patient ruled in for an MI, and cardiology consult was called.  He was  evaluated by cardiology, and it was felt that a cardiac catheterization was  indicated.  It was initially delayed because of a creatinine of 2.3, but  with his enzymes positive for MI, __________ only on August 03, 2002.  His  LAD had a proximal 95% stenosis before the first septal perforator and a 50%  stenosis after a large first diagonal.  The first diagonal had luminal  irregularities.  The circumflex had some diffuse luminal irregularities in  the AV groove, and the obtuse marginal was large and was totalled  proximally.  The ramus intermedius was present.  It was a large branching  vessel with a 30% stenosis at the bifurcation.  The RCA was dominant, was a  large vessel with diffuse mid and proximal luminal irregularities.  The PDA  had a 50% stenosis before the PL1 and PL2.  It was felt that he had severe  two-vessel coronary artery disease, and the films were reviewed with Dr.  Samule Ohm to decide whether percutaneous intervention or bypass surgery was the  best option.   It was felt that percutaneous intervention was the best option upon review  of the films by Dr. Antoine Poche and Dr. Chales Abrahams and Dr. Samule Ohm.  Therefore, on  August 05, 2002, he had repeat catheterization with PTCA and Taxus stent to  the proximal LAD as well as PTCA and Taxus stent to the OM 1.  In each case  the stenosis was reduced  to less than 10% with TIMI-3 flow in the LAD and  the OM, with TIMI-2 flow postprocedure.   The patient tolerated the procedure well.  However, the night after the  procedure he became agitated and combative and extremely hypertensive with a  systolic blood pressure over 200.  His sheath was still in, and they were  unable to remove the sheath secondary to his hypertension.  Because of this  and because of the inability to calm the patient despite a total of 10 mg of  Haldol IV as well as 5 mg of Ativan, he was intubated and placed on the  ventilator.  A pulmonary consult was called to help manage the ventilator.   The patient was seen by Dr. Sung Amabile for ventilator management.  He had some  edema on his chest x-ray and was diuresed over the next couple of days.  Additionally, he had medications added and changed to help manage his  hypertension.  Dr. Sung Amabile felt that he had toxic metabolic encephalopathy  of unclear etiology.  He initiated a weaning protocol, and by Aug 07, 2002,  the patient was able to be extubated.  He had no further edema by exam and  no further episodes of altered mental status  and was stable from a  respiratory standpoint after that.   The patient's blood pressure was very difficult to control.  At one point he  was on multiple doses of Lopressor, labetalol, and diuretics.  However, with  renal insufficiency the diuretics were held, and it was also felt that with  the renal insufficiency Lopressor was a poor choice for blood pressure  control as it is __________ and, therefore, he was changed to atenolol.  By  Aug 09, 2002, his BUN was 36, and his creatinine was 2.  It was felt that, at  least at present this is his baseline.  He was restarted on his ARB, and  atenolol was added to control his blood pressure.  Additionally, he was  placed on Norvasc at 10 mg a day for blood pressure control.  By Aug 09, 2002, his blood pressure on these medications was in the 150-160 range.  Further assessment and evaluation can be performed as an outpatient.   Because of the diuresis, the patient became hypokalemic and required  supplementation, but since the diuretics were discontinued he is not to be  discharged on any potassium supplement.  Additionally, the patient had  significant financial concerns as he has no insurance, and his medication  costs per month with all of his new medications is going to be in excess of  $350 a month.  Case management to see for medication help, and financial  counselor was also contacted.   Pending assessment by the financial counselor and a case manager for  medication assistance, the patient is considered stable for discharge on Aug 09, 2002, with outpatient follow-up arranged.   LABORATORY VALUES:  Hemoglobin 11.6, hematocrit 33.7, WBC 9, platelets 247.  Sodium 137, potassium 37, chloride 107, CO2 21, BUN 36, creatinine 2,  glucose 104.  AST 20, ALT 16, alkaline phosphatase 68, total bilirubin 1.2.  CK-MB peaked 700/107.4 with a troponin I peak of 10.76.  Glycosylated hemoglobin 4.9.  Total cholesterol 219, triglycerides 150, HDL 36,  LDL 153.   Chest x-ray dated August 06, 2002:  Endotracheal tube is satisfactory in  position with mild edema seen.   Two-dimensional echocardiogram:  EF 40-50% with hypokinesis of the lateral  wall and the inferoposterior wall.  Left ventricular wall thickness was  mildly increased.  Aortic valve was not well visualized.  Trivial aortic  valvular regurgitation with the left atrium mildly dilated.   CONDITION ON DISCHARGE:  Improved.   CONSULTATIONS:  1. Pulmonary.  2. Critical care medicine.  3. Internal medicine.   DISCHARGE DIAGNOSES:  1. Acute myocardial infarction, status post Taxus stent to the left anterior     descending and obtuse marginal this admission with residual coronary     artery disease and 30-60% of the left anterior descending circumflex,     ramus intermedius, and posterior descending artery.  2. Left ventricular dysfunction with an ejection fraction of 40-50% by     catheterization this admission.  3. Renal insufficiency with a creatinine of 2 at discharge.  4. Hypokalemia secondary to diuresis, resolved.  5. Hypertension.  6. Hyperlipidemia.  7. Hyperglycemia upon admission with a normal hemoglobin A1C.  8. Normocytic anemia.  9. Toxic metabolic encephalopathy requiring intubation of unknown etiology     postcatheterization, resolved.   DISCHARGE INSTRUCTIONS:  His activity level is to include no sexual or  strenuous activity until cleared by M.D.  He is to call the office for  problems with the catheterization site, and stick to a low-fat diet.  He is  to get a BMET and BP check with a nurse visit on Friday between 8:30 and 10  a.m.  He is to follow up with Dr. Cato Mulligan as needed.  He is to follow up with  Dr. Samule Ohm on Aug 30, 2002, at 12:30 p.m.   DISCHARGE MEDICATIONS:  1. Nitroglycerin p.r.n.  2. Avapro 150 mg daily.  3. Atenolol 100 mg daily.  4. Aspirin 325 mg daily.  5. Zocor 40 mg daily.  6. Plavix 75 mg daily.  7. Norvasc 10 mg  daily.     Lavella Hammock, P.A. [facility]                  Salvadore Farber, M.D.    RG/MEDQ  D:  08/09/2002  T:  08/09/2002  Job:  [id number]

## 2010-08-24 NOTE — H&P (Signed)
NAME:  Glen [name], [name]                      ACCOUNT NO.:  [id number]   MEDICAL RECORD NO.:  [id number]                   PATIENT TYPE:  INP   LOCATION:  2927                                 FACILITY:  [facility]   PHYSICIAN:  Bruce H. Swords, M.D. [facility]           DATE OF BIRTH:  1941-01-17   DATE OF ADMISSION:  08/02/2002  DATE OF DISCHARGE:                                HISTORY & PHYSICAL   CHIEF COMPLAINT:  Shortness of breath.   HISTORY OF PRESENT ILLNESS:  The patient is a 70 year old male who has not  seen a medical doctor in years. He came to my office early this morning with  complaints of vague, right-sided sternal chest discomfort, dyspnea on  exertion for 2 weeks' duration not associated with chest pain.  He was  markedly hypertensive in the office and he was treated with a combination of  Clonidine and Benicar.  Eventually, his blood pressure got down to 155/100.  While in my office, the patient refused hospitalization and that is clearly  documented in my office notes.  I did obtain laboratories and CK came back  abnormal at 686 with an MB of 90.5 and a relative index of 13.2.   At that time, I called the patient at home.  He was doing okay. I told him  he needed urgent hospitalization and I personally called EMS to have him  transported to the emergency department.   PAST MEDICAL HISTORY:  Unremarkable.   FAMILY HISTORY:  Per my office notes, which I will fax tomorrow morning.   SOCIAL HISTORY:  He owns his own business, currently is a nonsmoker, does  not drink alcohol. He is married.   REVIEW OF SYSTEMS:  As above. Denies any abdominal pain, gastroesophageal  reflux, GI blood loss, diarrhea, rashes or any other complaints in the  review of systems.   PHYSICAL EXAMINATION:  VITAL SIGNS:  Blood pressure 140/90, pulse 70,  respirations 14.  GENERAL:  He appears as a disheveled, obese male in no acute distress.  HEENT:  Normocephalic, atraumatic, extraocular  movements intact.  NECK:  Supple without lymphadenopathy, thyromegaly, jugulovenous distention  or carotid bruits.  LUNGS:  Clear to auscultation without increased work of breathing.  CARDIOVASCULAR:  S1/S2 are normal without murmurs or gallops.  ABDOMEN:  Active bowel sounds, nontender, nondistended.  No  hepatosplenomegaly, no masses are palpated.  EXTREMITIES:  No clubbing, cyanosis or edema.  NEUROLOGIC:  Alert and oriented without any motor or sensory deficits.   LABORATORY DATA:  EKG done in my office earlier demonstrates normal sinus  rhythm with an incomplete right bundle branch block and an old, inferior MI.  Chest x-ray will be done.   CK as above. White count 12.0, hemoglobin 16.4, platelet count 259,000.  BMET normal except for glucose of 121, BUN 30, creatinine 2.2.   ASSESSMENT/PLAN:  1. Myocardial infarction.  2. Malignant hypertension (initial blood pressure in the office  was     220/180).   Will admit the patient, will anticoagulate him (he was given aspirin in my  office earlier).  He will also be heparinized and will consider further  anticoagulation.  I will have cardiology perform a consultation and consider  angiography.                                               Bruce Rexene Edison Swords, M.D. [facility]    BHS/MEDQ  D:  08/02/2002  T:  08/02/2002  Job:  [id number]

## 2010-08-24 NOTE — Assessment & Plan Note (Signed)
 HEALTHCARE                              CARDIOLOGY OFFICE NOTE   Glen [name], Glen [name]                   MRN:          [id number]  DATE:12/24/2005                            DOB:          [birth year]    HISTORY OF PRESENT ILLNESS:  Mr. Stfort is a 70 year old gentleman who  suffered a lateral myocardial infarction in April 2004.  I placed a drug-  eluting stent in the culprit lesion in the circumflex and another in the  severe stenosis of the proximal LAD.  Since then, he has continued to do  well.  He has not had any exertional dyspnea, chest pain, PND, orthopnea,  edema, palpitations, syncope or claudication.  He has not had any bleeding  difficulties with continued aspirin and Plavix.   CURRENT MEDICATIONS:  1. Plavix 75 mg daily.  2. Ecotrin 325 mg daily.  3. Avapro 300 mg daily.  4. Atenolol 25 mg daily.  5. Niacin 500 mg daily.  6. Zocor 40 mg daily.  7. Norvasc 10 mg daily.   PHYSICAL EXAMINATION:  GENERAL:  On physical exam he is generally well-  appearing in no distress.  VITAL SIGNS:  Heart rate 52, blood pressure 118/72 and weight of 241 pounds.  Weight is up 10 pounds from 2 years ago.  NECK:  He has no jugular venous distention or thyromegaly.  LUNGS:  Clear to auscultation.  CARDIAC:  He has a nondisplaced point of maximal cardiac impulse.  There is  a regular rate and rhythm without murmur, rub, or gallop.  ABDOMEN:  Soft, nondistended, nontender.  There is no hepatosplenomegaly;  and no pulsatile midline mass.  EXTREMITIES:  Warm without clubbing, cyanosis, edema, or ulceration.  Carotid pulses are 2+ bilaterally without bruit.   ELECTROCARDIOGRAM:  Demonstrates sinus bradycardia and leftward axis.   LABORATORY STUDIES:  Dated 12/19/2005 remarkable for a creatinine of 1.8.  Previously it has ranged between 1.9 and 2.3.  Potassium 4.6.  LDL 70, HDL  22.5, ALT 25.   IMPRESSION/RECOMMENDATIONS:  1. Coronary disease:  He  continues to do well after lateral myocardial      infarction and 2-vessel drug-eluting stent placement. Continue      combination of both aspirin and Plavix indefinitely given the drug-      eluting stents, including one in the proximal LAD.  2. Hypertension.  Blood pressure nicely controlled, managed by Dr. Cato Mulligan.  3. Hypercholesterolemia, managed by Dr. Cato Mulligan.  Excellent control.  4. Chronic renal insufficiency.  Etiology not clear.  Creatinine stable      over several hears.  5. I will plan on seeing him back in 12 months' time.                                 Salvadore Farber, MD    WED/MedQ  DD:  12/24/2005  DT:  12/25/2005  Job #:  [id number]   cc:   Valetta Mole. Swords, MD

## 2010-08-24 NOTE — Cardiovascular Report (Signed)
NAME:  Glen [name], Glen [name]                      ACCOUNT NO.:  [id number]   MEDICAL RECORD NO.:  [id number]                   PATIENT TYPE:  INP   LOCATION:  2927                                 FACILITY:  [facility]   PHYSICIAN:  Veneda Melter, M.D.                   DATE OF BIRTH:  1940-11-22   DATE OF PROCEDURE:  08/05/2002  DATE OF DISCHARGE:                              CARDIAC CATHETERIZATION   PROCEDURE PERFORMED:  1. Percutaneous transluminal coronary angioplasty and stent placement,     proximal left anterior descending artery.  2. Percutaneous transluminal coronary angioplasty and stent placement,     obtuse marginal 1.   DIAGNOSES:  1. Coronary atherosclerotic disease.  2. Status post lateral wall myocardial infarction.   HISTORY:  The patient is a 70 year old gentleman who presented on August 02, 2002, with acute myocardial infarction.  The patient underwent cardiac  catheterization showing occlusion of a marginal branch of the left  circumflex artery.  He had residual high-grade narrowing in the proximal LAD  and moderate disease in the distal right coronary artery.  Due to renal  insufficiency, staged intervention to the LAD was planned.  He presents now  for further assessment.   TECHNIQUE:  Informed consent was obtained.  The patient was brought to the  catheterization lab.  A 7-French sheath was placed in the right femoral  artery using the modified Seldinger technique.  The patient had been given  heparin and ReoPro to maintain an ACT of greater than 225 seconds.  He was  also 600 mg of Plavix orally.  A 7-French Voda left #4 guide catheter was  then used to engage the left coronary artery, and selective guide shots  obtained.  This confirmed the presence of a high-grade narrowing of 80% in  the proximal LAD.  The large marginal branch of the left circumflex artery  was occluded in the mid section.  A 0.014-inch Forte wire was advanced in  the proximal LAD and used to  size the lesion length and vessel diameter.  This was then positioned distally.  A 3.0 x 12-mm Express2 Taxus stent was  introduced and carefully positioned in the proximal LAD and deployed at 10  atmospheres for 30 seconds.  A 3.5 x 12-mm Quantum Maverick balloon was then  used to post dilate the stent at 14 atmospheres for 45 seconds.  Repeat  angiography, after the administration of intracoronary nitroglycerin, showed  an excellent result with no residual stenosis, no distal vessel damage, and  TIMI-3 flow through the LAD.  The Forte wire was then repositioned in the  circumflex artery, and a brief attempt made to probe the occlusion in the  mid marginal branch.  The wire was successfully advanced into the distal  vessel.  A 2.5 x 15-mm Maverick balloon was then introduced and inflated at  the site of occlusion at 6 atmospheres for 30 seconds.  Repeat angiography  showed recanalization of the vessel.  There was diffuse disease in the mid  section of the marginal branch; however, flow was improved.  An additional 5  inflations were performed along the course of the mid section of the first  marginal branch at 6 atmospheres for 30 seconds.  Repeat angiography showed  TIMI-2 flow.  There was evidence of dissection just above the site of  occlusion.  We elected to cover this with a 2.75 x 28-mm Taxus Express2  stent, deployed at 9 atmospheres for 45 seconds.  Repeat angiography showed  coverage of the lesion; however, there continued to be sluggish TIMI grade 2  flow in the distal vessel.  The distal marginal branch was an extremely  small caliber vessel and diffusely diseased, yet it was felt that there was  poor outflow of blood due to the infarction, and submyocardial salvage was  unlikely, with only moderate residual narrowing of 10% within the stented  segment.  The wire was removed.  The guide catheter was removed as well.  The sheath was secured into position.  The patient tolerated  the procedure  well and was transferred to the floor in stable condition.    FINAL RESULTS:  1. Successful PTCA and stent placement, proximal LAD, with reduction of 80%     narrowing to 0% with a 3.0 x 12-mm Taxus stent, dilated to 3.5 mm.  2. Successful PTCA and stent placement in the first marginal branch of the     left circumflex artery with reduction of 100% narrowing to 10% to 20%     with placement of a 2.75 x 28-mm Taxus stent, with improvement of TIMI     grade 0 to TIMI grade 2 flow.   ASSESSMENT AND PLAN:  The patient is a 70 year old gentleman with moderate 3-  vessel coronary artery disease.  He has undergone percutaneous intervention  of the LAD with a drug-eluting stent.  He will be continued on Plavix for a  minimum of 6 months' time.  The left circumflex artery has been  recannulated; however, there is sluggish flow distally, which puts him at a  poor prognosis for long-term patency, and thus, further interventions are  likely to be beneficial.  He has a distal RCA lesion that will be medically  managed.  Further assessment with a stress imaging study may be performed.  Should the patient have symptoms, percutaneous intervention may be  considered.                                               Veneda Melter, M.D.    NG/MEDQ  D:  08/05/2002  T:  08/06/2002  Job:  [id number]   cc:   Valetta Mole. Swords, M.D. [facility]   Salvadore Farber, M.D.

## 2010-08-24 NOTE — Consult Note (Signed)
NAME:  Glen [name], Glen [name]                      ACCOUNT NO.:  [id number]   MEDICAL RECORD NO.:  [id number]                   PATIENT TYPE:  INP   LOCATION:  2927                                 FACILITY:  [facility]   PHYSICIAN:  Salvadore Farber, M.D.             DATE OF BIRTH:  1941/01/30   DATE OF CONSULTATION:  08/02/2002  DATE OF DISCHARGE:                                   CONSULTATION   REQUESTING PHYSICIAN:  Dr. Valetta Mole. Swords.   CHIEF COMPLAINT:  Chest pain, dyspnea.   HISTORY OF PRESENT ILLNESS:  I am asked by Dr. Cato Mulligan to see this 70-year-  old man without prior cardiac history.  The patient is a vague historian but  describes a three-day history of right-sided chest discomfort and exertional  dyspnea.  Beginning three days prior to admission, he has had right-sided  chest discomfort.  He is unable to describe the duration of any of these  episodes.  He does describe one episode that seems to have lasted at least  an hour, occurring on Saturday, which was exacerbated by mowing the lawn and  slowly relieved after the cessation of exercise.  Over the same period, he  has had mild exertional dyspnea without orthopnea, PND, or edema.  He has  had no palpitations and no presyncope or syncope.   The patient presented today to Dr. Cato Mulligan with these complaints as a new  patient.  He was found to have a blood pressure of approximately 200/120.  His blood pressure was treated with clonidine in the office and  hospitalization recommended.  The patient initially deferred.  Laboratory  studies were drawn, which they returned remarkable for a  CK of 686 with an  MB fraction of 91.  Electrocardiogram demonstrated inferoposterior  myocardial infarction of indeterminate age.  The patient then consented to  hospitalization.  He is now admitted to the cardiac intensive care unit.   PAST MEDICAL HISTORY:  None.  Essentially no prior medical care.   MEDICATIONS:  None.   ALLERGIES:  No  known drug allergies.   SOCIAL HISTORY:  The patient is married with one son who lives with them.  He is self-employed as a Armed forces training and education officer.  Denies tobacco use.  Uses alcohol  rarely.   FAMILY HISTORY:  Remarkable for father dying at 20 of ? cerebral aneurysm.  Mother died at 79 of stroke.  He has two brothers whose health status he is  unaware.   REVIEW OF SYSTEMS:  Negative in detail except as above, with the exception  of several episodes of difficulty with balance occurring over the past year.  He cannot characterize these episodes at all.  He describes three episodes  of small amount of bright red blood per rectum occurring when straining over  the past two weeks.   PHYSICAL EXAMINATION:  GENERAL/VITAL SIGNS:  This is a generally well-  appearing man in no distress with  heart rate 60, blood pressure 168/95.  He  has a respiratory rate of 17.  NECK:  There is no jugular venous distention.  LUNGS:  Lungs are clear to auscultation.  CARDIAC:  He has a nondisplaced point of maximal cardiac impulse.  There is  a regular rate and rhythm with a 1/6 holosystolic murmur at the apex.  There  is an S4 but no S3.  ABDOMEN:  The abdomen is obese, soft, nondistended and nontender.  There is  no hepatosplenomegaly.  Bowel sounds are normal.  EXTREMITIES:  Extremities are warm without clubbing, cyanosis, edema, or  ulceration.  PERIPHERAL VASCULAR:  Carotid pulses are 2+ bilaterally without bruits.  Femoral pulses are 2+ bilaterally without bruits.  Dorsalis pedis and PT  pulses are trace bilaterally.   LABORATORY STUDIES:  Laboratory studies are remarkable for CK-MB of 91,  creatinine of 2.2 (no prior study available), hematocrit of 44, potassium  4.0.   Electrocardiogram demonstrates normal sinus rhythm with an inferoposterior  MI of indeterminate age.   IMPRESSION/RECOMMENDATIONS:  This 70 year old gentleman appears to have  suffered a sizable myocardial infarction approximately three  days ago.  He  has subsequently suffered post-infarct angina with perhaps some mild  congestive heart failure.  Agree with Dr. Cato Mulligan' therapy of aspirin,  heparin, beta blocker, and intravenous nitroglycerin for blood pressure  control.  Will hold off on ACE inhibitor until the stability of his renal  function is more clear.  Will also start a statin and check a lipid profile.   Given his clear post-infarct angina, we will plan on proceeding to  catheterization.  We will hydrate him aggressively beginning now to ensure  that he is well-hydrated, given his elevated creatinine.  Timing of  catheterization will depend upon the response of his creatinine to  hydration.                                                Salvadore Farber, M.D.    WED/MEDQ  D:  08/02/2002  T:  08/03/2002  Job:  [id number]   cc:   Valetta Mole. Swords, M.D. [facility]

## 2010-08-24 NOTE — Cardiovascular Report (Signed)
NAME:  Glen [name], Glen [name]                      ACCOUNT NO.:  [id number]   MEDICAL RECORD NO.:  [id number]                   PATIENT TYPE:  INP   LOCATION:  2927                                 FACILITY:  [facility]   PHYSICIAN:  Rollene Rotunda, M.D.                DATE OF BIRTH:  21-Jan-1941   DATE OF PROCEDURE:  08/03/2002  DATE OF DISCHARGE:                              CARDIAC CATHETERIZATION   PRIMARY CARE PHYSICIAN:  Valetta Mole. Swords, M.D.   PROCEDURE:  Left heart catheterization/coronary arteriography.   INDICATIONS FOR PROCEDURE:  Evaluate patient with out-of-hospital myocardial  infarction.   PROCEDURAL NOTE:  Left heart catheterization was performed via the right  femoral artery.  The artery was cannulated using an anterior wall puncture.  A 6-French arterial sheath was inserted via the modified Seldinger  technique.  Preformed Judkins and a pigtail catheter were utilized.  Of  note, a Judkins left #5 was used to cannulate the left main.   RESULTS:  1. Hemodynamics     A. LV:  176/16.     B. AO:  187/108.  2. Coronaries     A. The left main had luminal irregularities.     B. The LAD had a proximal 95% stenosis before the first septal        perforator.  There was 60% stenosis after a large first diagonal.  The        first diagonal had luminal irregularities.     C. The circumflex in the AV groove had diffuse luminal irregularities.        There was a very large ramus intermedius which was branching and had        30% stenosis at the bifurcation.  The vessel essentially terminated as        a large mid obtuse marginal which was occluded proximally.     D. The right coronary artery was a very large dominant vessel.  There        were diffuse luminal irregularities.  The PDA had diffuse luminal        irregularities.  There was 50% stenosis before 2 moderate-sized        posterolaterals.     E. LV:  The LV was not injected secondary to renal insufficiency.   CONCLUSION:   Severe 2-vessel coronary artery disease.  The infarct vessel is  the circumflex.   PLAN:  I will review the films with Dr. Samule Ohm.  I would suggest  percutaneous revascularization of the LAD and medical management of the  other disease.  We will need to watch his kidneys very carefully and hydrate  the patient before giving him any more dye, however.  He does have renal  insufficiency.  I limited the dye to approximately 40 cc.  Rollene Rotunda, M.D.   JH/MEDQ  D:  08/03/2002  T:  08/04/2002  Job:  [id number]   cc:   Valetta Mole. Swords, M.D. [facility]

## 2010-09-10 ENCOUNTER — Encounter: Payer: Self-pay | Admitting: Gastroenterology

## 2010-09-10 ENCOUNTER — Ambulatory Visit (AMBULATORY_SURGERY_CENTER): Payer: Medicare PPO | Admitting: *Deleted

## 2010-09-10 VITALS — Ht 72.0 in | Wt 236.0 lb

## 2010-09-10 DIAGNOSIS — Z86010 Personal history of colonic polyps: Secondary | ICD-10-CM

## 2010-09-10 MED ORDER — PEG-KCL-NACL-NASULF-NA ASC-C 100 G PO SOLR: ORAL | Status: DC

## 2010-09-24 ENCOUNTER — Encounter: Payer: Self-pay | Admitting: Gastroenterology

## 2010-09-24 ENCOUNTER — Ambulatory Visit (AMBULATORY_SURGERY_CENTER): Payer: Medicare PPO | Admitting: Gastroenterology

## 2010-09-24 VITALS — BP 145/92 | HR 54 | Temp 97.5°F | Resp 16 | Ht 72.0 in | Wt 234.0 lb

## 2010-09-24 DIAGNOSIS — Z860100 Personal history of colon polyps, unspecified: Secondary | ICD-10-CM

## 2010-09-24 DIAGNOSIS — Z1211 Encounter for screening for malignant neoplasm of colon: Secondary | ICD-10-CM

## 2010-09-24 MED ORDER — SODIUM CHLORIDE 0.9 % IV SOLN: 500.0000 mL | INTRAVENOUS | Status: DC

## 2010-09-24 NOTE — Patient Instructions (Addendum)
Green and blue discharge instructions reviewed with patient and care partner.  Normal colonoscopy.  Continue medications as you were previously taking them prior to procedure.

## 2010-09-25 ENCOUNTER — Telehealth: Payer: Self-pay | Admitting: *Deleted

## 2010-09-25 NOTE — Telephone Encounter (Signed)

## 2010-10-23 ENCOUNTER — Telehealth: Payer: Self-pay | Admitting: *Deleted

## 2010-10-23 MED ORDER — OLMESARTAN MEDOXOMIL 40 MG PO TABS: 40.0000 mg | ORAL_TABLET | Freq: Every day | ORAL | Status: DC

## 2010-10-23 NOTE — Telephone Encounter (Signed)
Refill benicar 

## 2010-10-25 ENCOUNTER — Ambulatory Visit: Payer: 59 | Admitting: Internal Medicine

## 2010-10-30 ENCOUNTER — Ambulatory Visit: Payer: 59 | Admitting: Internal Medicine

## 2010-11-07 ENCOUNTER — Other Ambulatory Visit: Payer: Self-pay | Admitting: *Deleted

## 2010-11-07 MED ORDER — LOSARTAN POTASSIUM 100 MG PO TABS: 100.0000 mg | ORAL_TABLET | Freq: Every day | ORAL | Status: DC

## 2010-11-07 NOTE — Telephone Encounter (Signed)
Retail banker.  Per Dr Cato Mulligan call in losartan 100mg  1 po qd.  rx called in, pt aware

## 2010-11-19 ENCOUNTER — Ambulatory Visit (INDEPENDENT_AMBULATORY_CARE_PROVIDER_SITE_OTHER): Payer: Medicare PPO | Admitting: Internal Medicine

## 2010-11-19 ENCOUNTER — Encounter: Payer: Self-pay | Admitting: Internal Medicine

## 2010-11-19 DIAGNOSIS — Z23 Encounter for immunization: Secondary | ICD-10-CM

## 2010-11-19 DIAGNOSIS — N259 Disorder resulting from impaired renal tubular function, unspecified: Secondary | ICD-10-CM

## 2010-11-19 DIAGNOSIS — E785 Hyperlipidemia, unspecified: Secondary | ICD-10-CM

## 2010-11-19 DIAGNOSIS — I2510 Atherosclerotic heart disease of native coronary artery without angina pectoris: Secondary | ICD-10-CM

## 2010-11-19 DIAGNOSIS — E669 Obesity, unspecified: Secondary | ICD-10-CM

## 2010-11-19 LAB — BASIC METABOLIC PANEL
BUN: 31 mg/dL — ABNORMAL HIGH (ref 6–23)
CO2: 25 mEq/L (ref 19–32)
Chloride: 104 mEq/L (ref 96–112)
Creatinine, Ser: 1.9 mg/dL — ABNORMAL HIGH (ref 0.4–1.5)
Glucose, Bld: 120 mg/dL — ABNORMAL HIGH (ref 70–99)

## 2010-11-19 LAB — HEPATIC FUNCTION PANEL
Albumin: 4 g/dL (ref 3.5–5.2)
Alkaline Phosphatase: 70 U/L (ref 39–117)
Bilirubin, Direct: 0 mg/dL (ref 0.0–0.3)
Total Bilirubin: 0.9 mg/dL (ref 0.3–1.2)

## 2010-11-19 LAB — LIPID PANEL
LDL Cholesterol: 65 mg/dL (ref 0–99)
Total CHOL/HDL Ratio: 4

## 2010-11-19 NOTE — Assessment & Plan Note (Signed)
No sxs Continue risk factor modification 

## 2010-11-19 NOTE — Assessment & Plan Note (Signed)
Needs regular f/u 

## 2010-11-19 NOTE — Progress Notes (Signed)
  Subjective:    Patient ID: Glen [name], male    DOB: 05-10-1940, 70 y.o.   MRN: [id number]  HPI  Patient Active Problem List  Diagnoses  . HYPERLIPIDEMIA---tolerating meds  . OBESITY--not following a diet or exercise plan  . HYPERTENSION---no sxs on meds  . CORONARY ARTERY DISEASE---no sxs: he tries to stay active with gardening  . RENAL INSUFFICIENCY--- needs regular f/u---discussed with patient   Past Medical History  Diagnosis Date  . CAD (coronary artery disease) 2004    s/p MI, s/p stents LCX and prox LAD   . Hypertension   . Chronic kidney disease     renal failure  . Hyperlipidemia   . Hx of colonoscopy 05/15/2005  . Obesity   . Bradycardia     requiring discontinuation of b-blocker  . Renal insufficiency   . Myocardial infarction     08/02/2002   Past Surgical History  Procedure Date  . Cardiac catheterization   . Cardiac catheterization 2004    2 stents placed  . Colonoscopy   . Polypectomy     reports that he has never smoked. He has never used smokeless tobacco. He reports that he drinks about .6 ounces of alcohol per week. His drug history not on file. family history includes Alcohol abuse in his brother; Aneurysm in his father; Heart attack in his brother; and Hypertension in his father. Allergies  Allergen Reactions  . Atenolol     REACTION: bradycardia    Review of Systems  patient denies chest pain, shortness of breath, orthopnea. Denies lower extremity edema, abdominal pain, change in appetite, change in bowel movements. Patient denies rashes, musculoskeletal complaints. No other specific complaints in a complete review of systems.      Objective:   Physical Exam  well-developed well-nourished male in no acute distress. HEENT exam atraumatic, normocephalic, neck supple without jugular venous distention. Chest clear to auscultation cardiac exam S1-S2 are regular. Abdominal exam overweight with bowel sounds, soft and nontender. Extremities no  edema. Neurologic exam is alert with a normal gait.     Assessment & Plan:

## 2010-11-19 NOTE — Assessment & Plan Note (Signed)
Check labs today.

## 2010-11-19 NOTE — Assessment & Plan Note (Signed)
Advised aggressive weight loss

## 2010-11-26 ENCOUNTER — Ambulatory Visit: Payer: Self-pay | Admitting: Internal Medicine

## 2010-12-07 ENCOUNTER — Telehealth: Payer: Self-pay | Admitting: Internal Medicine

## 2010-12-07 NOTE — Telephone Encounter (Signed)
Pt called and said that he had faxed over a question whether it would be ok to take Vitamin C 1000 mg and Vitamin E 100 IU daily or not? Pt sent this on 8/22 and have not rcvd a response. These vitamins were suggested by pts eye doctor. Pls fax a response back if pt unavailable to [phone]

## 2010-12-10 NOTE — Telephone Encounter (Signed)
I would not recommend vitamin D. It is okay for him to take vitamin C if he so desires

## 2010-12-11 NOTE — Telephone Encounter (Signed)
Pt aware.

## 2010-12-11 NOTE — Telephone Encounter (Signed)
Do not take vitamin E

## 2010-12-11 NOTE — Telephone Encounter (Signed)
Vit E or D?  Pt was asking about Vit E

## 2011-01-08 ENCOUNTER — Telehealth: Payer: Self-pay | Admitting: Internal Medicine

## 2011-01-08 NOTE — Telephone Encounter (Signed)
Glen [name] told patient he would need to come in November for labs. What labs to order? Thanks.

## 2011-01-09 NOTE — Telephone Encounter (Signed)
Pt has appt scheduled in February are labs supposed to be a week prior to this?

## 2011-01-09 NOTE — Telephone Encounter (Signed)
Lipid, liver, BMET

## 2011-01-11 NOTE — Telephone Encounter (Signed)
Per Dr Cato Mulligan labs one prior to visit

## 2011-01-29 ENCOUNTER — Ambulatory Visit (INDEPENDENT_AMBULATORY_CARE_PROVIDER_SITE_OTHER): Payer: Medicare PPO

## 2011-01-29 DIAGNOSIS — Z23 Encounter for immunization: Secondary | ICD-10-CM

## 2011-02-04 ENCOUNTER — Other Ambulatory Visit: Payer: Self-pay | Admitting: Internal Medicine

## 2011-02-23 ENCOUNTER — Other Ambulatory Visit: Payer: Self-pay | Admitting: Internal Medicine

## 2011-05-15 ENCOUNTER — Other Ambulatory Visit (INDEPENDENT_AMBULATORY_CARE_PROVIDER_SITE_OTHER): Payer: Medicare PPO

## 2011-05-15 DIAGNOSIS — N259 Disorder resulting from impaired renal tubular function, unspecified: Secondary | ICD-10-CM

## 2011-05-15 DIAGNOSIS — I2510 Atherosclerotic heart disease of native coronary artery without angina pectoris: Secondary | ICD-10-CM

## 2011-05-15 LAB — BASIC METABOLIC PANEL
BUN: 28 mg/dL — ABNORMAL HIGH (ref 6–23)
CO2: 24 mEq/L (ref 19–32)
Chloride: 106 mEq/L (ref 96–112)
Creatinine, Ser: 1.6 mg/dL — ABNORMAL HIGH (ref 0.4–1.5)
Glucose, Bld: 88 mg/dL (ref 70–99)

## 2011-05-15 LAB — HEPATIC FUNCTION PANEL
ALT: 20 U/L (ref 0–53)
AST: 17 U/L (ref 0–37)
Bilirubin, Direct: 0 mg/dL (ref 0.0–0.3)
Total Protein: 7 g/dL (ref 6.0–8.3)

## 2011-05-15 LAB — LIPID PANEL
LDL Cholesterol: 67 mg/dL (ref 0–99)
Total CHOL/HDL Ratio: 4
Triglycerides: 152 mg/dL — ABNORMAL HIGH (ref 0.0–149.0)

## 2011-05-22 ENCOUNTER — Ambulatory Visit (INDEPENDENT_AMBULATORY_CARE_PROVIDER_SITE_OTHER): Payer: Medicare PPO | Admitting: Internal Medicine

## 2011-05-22 ENCOUNTER — Encounter: Payer: Self-pay | Admitting: Internal Medicine

## 2011-05-22 VITALS — BP 136/88 | HR 84 | Temp 98.2°F | Ht 72.0 in | Wt 241.0 lb

## 2011-05-22 DIAGNOSIS — I2510 Atherosclerotic heart disease of native coronary artery without angina pectoris: Secondary | ICD-10-CM

## 2011-05-22 DIAGNOSIS — I10 Essential (primary) hypertension: Secondary | ICD-10-CM

## 2011-05-22 DIAGNOSIS — N259 Disorder resulting from impaired renal tubular function, unspecified: Secondary | ICD-10-CM

## 2011-05-22 NOTE — Progress Notes (Signed)
Patient ID: Glen [name], male   DOB: 1941/02/08, 71 y.o.   MRN: [id number] Patient Active Problem List  Diagnoses  . HYPERLIPIDEMIA---tolerating meds  . OBESITY--not following a diet or exercise plan  . HYPERTENSION--no home bps but he is compliant with meds  . CORONARY ARTERY DISEASE-- no sxs  . RENAL INSUFFICIENCY--needs regular f/u  Lab Results  Component Value Date   CREATININE 1.6* 05/15/2011      Past Medical History  Diagnosis Date  . CAD (coronary artery disease) 2004    s/p MI, s/p stents LCX and prox LAD   . Hypertension   . Chronic kidney disease     renal failure  . Hyperlipidemia   . Hx of colonoscopy 05/15/2005  . Obesity   . Bradycardia     requiring discontinuation of b-blocker  . Renal insufficiency   . Myocardial infarction     08/02/2002    History   Social History  . Marital Status: Married    Spouse Name: N/A    Number of Children: N/A  . Years of Education: N/A   Occupational History  . Not on file.   Social History Main Topics  . Smoking status: Never Smoker   . Smokeless tobacco: Never Used  . Alcohol Use: 0.6 oz/week    1 Glasses of wine per week  . Drug Use: Not on file  . Sexually Active: Not on file   Other Topics Concern  . Not on file   Social History Narrative  . No narrative on file    Past Surgical History  Procedure Date  . Cardiac catheterization   . Cardiac catheterization 2004    2 stents placed  . Colonoscopy   . Polypectomy     Family History  Problem Relation Age of Onset  . Aneurysm Father     brain  . Hypertension Father   . Heart attack Brother   . Alcohol abuse Brother     Allergies  Allergen Reactions  . Atenolol     REACTION: bradycardia    Current Outpatient Prescriptions on File Prior to Visit  Medication Sig Dispense Refill  . amLODipine (NORVASC) 10 MG tablet TAKE 1 TABLET DAILY  90 tablet  1  . aspirin 81 MG tablet Take 1 tab daily      . niacin 250 MG tablet Take 500 mg by mouth  daily.       Marland Kitchen olmesartan (BENICAR) 40 MG tablet Take 40 mg by mouth daily.        . simvastatin (ZOCOR) 40 MG tablet TAKE 1 TABLET AT BEDTIME  90 tablet  3     patient denies chest pain, shortness of breath, orthopnea. Denies lower extremity edema, abdominal pain, change in appetite, change in bowel movements. Patient denies rashes, musculoskeletal complaints. No other specific complaints in a complete review of systems.   BP 136/88  Pulse 84  Temp(Src) 98.2 F (36.8 C) (Oral)  Ht 6' (1.829 m)  Wt 241 lb (109.317 kg)  BMI 32.69 kg/m2  well-developed well-nourished male in no acute distress. HEENT exam atraumatic, normocephalic, neck supple without jugular venous distention. Chest clear to auscultation cardiac exam S1-S2 are regular. Abdominal exam overweight with bowel sounds, soft and nontender. Extremities no edema. Neurologic exam is alert with a normal gait.

## 2011-05-22 NOTE — Assessment & Plan Note (Signed)
No current sxs Continue risk factor modification Needs to exercise and lose weight

## 2011-05-22 NOTE — Assessment & Plan Note (Addendum)
Lab Results  Component Value Date   CREATININE 1.6* 05/15/2011   Stable, will need regular follow up

## 2011-05-22 NOTE — Assessment & Plan Note (Signed)
Fair control Would likely be much better with weight loss and daily exercise

## 2011-07-29 ENCOUNTER — Other Ambulatory Visit: Payer: Self-pay | Admitting: *Deleted

## 2011-07-29 MED ORDER — AMLODIPINE BESYLATE 10 MG PO TABS: 10.0000 mg | ORAL_TABLET | Freq: Every day | ORAL | Status: DC

## 2011-08-07 ENCOUNTER — Encounter: Payer: Self-pay | Admitting: Cardiology

## 2011-08-07 ENCOUNTER — Ambulatory Visit (INDEPENDENT_AMBULATORY_CARE_PROVIDER_SITE_OTHER): Payer: Medicare PPO | Admitting: Cardiology

## 2011-08-07 VITALS — BP 145/80 | Ht 72.0 in | Wt 239.8 lb

## 2011-08-07 DIAGNOSIS — I2510 Atherosclerotic heart disease of native coronary artery without angina pectoris: Secondary | ICD-10-CM

## 2011-08-07 DIAGNOSIS — E669 Obesity, unspecified: Secondary | ICD-10-CM

## 2011-08-07 DIAGNOSIS — I10 Essential (primary) hypertension: Secondary | ICD-10-CM

## 2011-08-07 DIAGNOSIS — E785 Hyperlipidemia, unspecified: Secondary | ICD-10-CM

## 2011-08-07 MED ORDER — PRAVASTATIN SODIUM 80 MG PO TABS: 80.0000 mg | ORAL_TABLET | Freq: Every evening | ORAL | Status: DC

## 2011-08-07 NOTE — Assessment & Plan Note (Signed)
I will bring the patient back for a POET (Plain Old Exercise Test). This will allow me to screen for obstructive coronary disease, risk stratify and very importantly provide a prescription for exercise. I

## 2011-08-07 NOTE — Progress Notes (Signed)
   HPI The patient presents as a new patient for me.  He saw Dr. Gala Romney.  He has a history of CAD.  Since his PCI years ago he has had no new cardiovascular symptoms. The patient denies any new symptoms such as chest discomfort, neck or arm discomfort. There has been no new shortness of breath, PND or orthopnea. There have been no reported palpitations, presyncope or syncope.  He does not exercise routinely but he does some yard work in the summer.  Allergies  Allergen Reactions  . Atenolol     REACTION: bradycardia    Current Outpatient Prescriptions  Medication Sig Dispense Refill  . amLODipine (NORVASC) 10 MG tablet Take 1 tablet (10 mg total) by mouth daily.  90 tablet  1  . Ascorbic Acid (VITAMIN C) 1000 MG tablet Take 1,000 mg by mouth daily.      Marland Kitchen aspirin 81 MG tablet Take 1 tab daily      . fish oil-omega-3 fatty acids 1000 MG capsule Take 500 mg by mouth. Take 4 capsules daily      . losartan (COZAAR) 100 MG tablet Take 1 tablet by mouth daily.      . niacin 250 MG tablet Take 500 mg by mouth daily.       . simvastatin (ZOCOR) 40 MG tablet TAKE 1 TABLET AT BEDTIME  90 tablet  3    Past Medical History  Diagnosis Date  . CAD (coronary artery disease) 2004    s/p MI, s/p stents LCX and prox LAD   . Hypertension   . Chronic kidney disease     renal failure  . Hyperlipidemia   . Hx of colonoscopy 05/15/2005  . Obesity   . Bradycardia     requiring discontinuation of b-blocker  . Renal insufficiency   . Myocardial infarction     08/02/2002    Past Surgical History  Procedure Date  . Cardiac catheterization   . Cardiac catheterization 2004    2 stents placed  . Colonoscopy   . Polypectomy    ROS:  As stated in the HPI and negative for all other systems.  PHYSICAL EXAM BP 145/80  Ht 6' (1.829 m)  Wt 239 lb 12.8 oz (108.773 kg)  BMI 32.52 kg/m2 GENERAL:  Well appearing HEENT:  Pupils equal round and reactive, fundi not visualized, oral mucosa  unremarkable NECK:  No jugular venous distention, waveform within normal limits, carotid upstroke brisk and symmetric, no bruits, no thyromegaly LYMPHATICS:  No cervical, inguinal adenopathy LUNGS:  Clear to auscultation bilaterally BACK:  No CVA tenderness CHEST:  Unremarkable HEART:  PMI not displaced or sustained,S1 and S2 within normal limits, no S3, no S4, no clicks, no rubs, no murmurs ABD:  Flat, positive bowel sounds normal in frequency in pitch, no bruits, no rebound, no guarding, no midline pulsatile mass, no hepatomegaly, no splenomegaly EXT:  2 plus pulses throughout, no edema, no cyanosis no clubbing SKIN:  No rashes no nodules NEURO:  Cranial nerves II through XII grossly intact, motor grossly intact throughout PSYCH:  Cognitively intact, oriented to person place and time  EKG:  Sinus rhythm, rate 61, left axis deviation, intervals within normal limits, no acute ST-T wave changes.  ASSESSMENT AND PLAN

## 2011-08-07 NOTE — Assessment & Plan Note (Signed)
I will take him off the I will Zocor and switch to Pravachol as he is on Norvasc.  I will repeat a lipid and liver in 8 weeks.

## 2011-08-07 NOTE — Assessment & Plan Note (Signed)
The blood pressure is at target. No change in medications is indicated. We will continue with therapeutic lifestyle changes (TLC).  

## 2011-08-07 NOTE — Patient Instructions (Signed)
Please stop Zocor and start Pravachol 80 mg a day Continue all other medications as listed.  Your physician has requested that you have an exercise tolerance test in 8 weeks. For further information please visit [email or web address] Please also follow instruction sheet, as given.  Please have fasting lipid panel in 8 weeks.

## 2011-08-07 NOTE — Assessment & Plan Note (Signed)
I will give him a prescription for exercise based on the stress test.

## 2011-08-20 ENCOUNTER — Other Ambulatory Visit: Payer: Self-pay | Admitting: *Deleted

## 2011-08-20 DIAGNOSIS — I10 Essential (primary) hypertension: Secondary | ICD-10-CM

## 2011-08-20 MED ORDER — LOSARTAN POTASSIUM 100 MG PO TABS: 100.0000 mg | ORAL_TABLET | Freq: Every day | ORAL | Status: DC

## 2011-10-02 ENCOUNTER — Encounter: Payer: Self-pay | Admitting: Physician Assistant

## 2011-10-02 ENCOUNTER — Other Ambulatory Visit (INDEPENDENT_AMBULATORY_CARE_PROVIDER_SITE_OTHER): Payer: Medicare PPO

## 2011-10-02 ENCOUNTER — Ambulatory Visit (INDEPENDENT_AMBULATORY_CARE_PROVIDER_SITE_OTHER): Payer: Medicare PPO | Admitting: Physician Assistant

## 2011-10-02 DIAGNOSIS — R0989 Other specified symptoms and signs involving the circulatory and respiratory systems: Secondary | ICD-10-CM

## 2011-10-02 DIAGNOSIS — I2510 Atherosclerotic heart disease of native coronary artery without angina pectoris: Secondary | ICD-10-CM

## 2011-10-02 DIAGNOSIS — E785 Hyperlipidemia, unspecified: Secondary | ICD-10-CM

## 2011-10-02 LAB — LIPID PANEL
HDL: 29.2 mg/dL — ABNORMAL LOW (ref >39.00)
Total CHOL/HDL Ratio: 4
Triglycerides: 155 mg/dL — ABNORMAL HIGH (ref 0.0–149.0)

## 2011-10-02 NOTE — Procedures (Signed)
Exercise Treadmill Test  Pre-Exercise Testing Evaluation Rhythm: sinus bradycardia  Rate: 52   PR:  .15 QRS:  .12  QT:  .43 QTc: .40     Test  Exercise Tolerance Test Ordering MD: Angelina Sheriff, MD  Interpreting MD: Tereso Newcomer , PA-C  Unique Test No: 1  Treadmill:  1  Indication for ETT: known ASHD  Contraindication to ETT: No   Stress Modality: exercise - treadmill  Cardiac Imaging Performed: non   Protocol: standard Bruce - maximal  Max BP:  183/63  Max MPHR (bpm):  150 85% MPR (bpm):  128  MPHR obtained (bpm):  103 % MPHR obtained:  69%  Reached 85% MPHR (min:sec):  n/a Total Exercise Time (min-sec):  4:10  Workload in METS:  5.9 Borg Scale: 15  Reason ETT Terminated:  patient's desire to stop    ST Segment Analysis At Rest: normal ST segments - no evidence of significant ST depression With Exercise: no evidence of significant ST depression  Other Information Arrhythmia:  No Angina during ETT:  absent (0) Quality of ETT:  non-diagnostic  ETT Interpretation:  normal - no evidence of ischemia by ST analysis at submaximal exercise  Comments: Poor exercise tolerance. No chest pain. Normal BP response to exercise. No ST-T changes to suggest ischemia at submaximal exercise.   Recommendations: Follow up with Dr. Rollene Rotunda as directed. Tereso Newcomer, PA-C  11:09 AM 10/02/2011

## 2011-10-08 ENCOUNTER — Encounter: Payer: Self-pay | Admitting: *Deleted

## 2011-10-16 ENCOUNTER — Telehealth: Payer: Self-pay | Admitting: *Deleted

## 2011-10-16 NOTE — Telephone Encounter (Signed)
Pt aware no further testing is needed at this time per Dr Antoine Poche

## 2011-11-18 ENCOUNTER — Encounter: Payer: Self-pay | Admitting: Internal Medicine

## 2011-11-18 ENCOUNTER — Ambulatory Visit (INDEPENDENT_AMBULATORY_CARE_PROVIDER_SITE_OTHER): Payer: Medicare PPO | Admitting: Internal Medicine

## 2011-11-18 VITALS — BP 136/84 | HR 68 | Temp 98.0°F | Ht 72.0 in | Wt 234.0 lb

## 2011-11-18 DIAGNOSIS — E785 Hyperlipidemia, unspecified: Secondary | ICD-10-CM

## 2011-11-18 DIAGNOSIS — I10 Essential (primary) hypertension: Secondary | ICD-10-CM

## 2011-11-18 DIAGNOSIS — I2510 Atherosclerotic heart disease of native coronary artery without angina pectoris: Secondary | ICD-10-CM

## 2011-11-18 NOTE — Assessment & Plan Note (Signed)
No sxs Continue risk factor modification 

## 2011-11-18 NOTE — Progress Notes (Signed)
Patient ID: Glen [name], male   DOB: [birth year], 71 y.o.   MRN: [id number] CAd-- no sxs, tolerating meds  htn- no home BPs  renla insuff- needs regular follow up  Past Medical History  Diagnosis Date  . CAD (coronary artery disease) 2004    s/p MI, s/p stents LCX and prox LAD   . Hypertension   . Chronic kidney disease     renal failure  . Hyperlipidemia   . Hx of colonoscopy 05/15/2005  . Obesity   . Bradycardia     requiring discontinuation of b-blocker  . Renal insufficiency   . Myocardial infarction     08/02/2002    History   Social History  . Marital Status: Married    Spouse Name: N/A    Number of Children: N/A  . Years of Education: N/A   Occupational History  . Not on file.   Social History Main Topics  . Smoking status: Never Smoker   . Smokeless tobacco: Never Used  . Alcohol Use: 0.6 oz/week    1 Glasses of wine per week  . Drug Use: Not on file  . Sexually Active: Not on file   Other Topics Concern  . Not on file   Social History Narrative  . No narrative on file    Past Surgical History  Procedure Date  . Cardiac catheterization   . Cardiac catheterization 2004    2 stents placed  . Colonoscopy   . Polypectomy     Family History  Problem Relation Age of Onset  . Aneurysm Father     brain  . Hypertension Father   . Heart attack Brother   . Alcohol abuse Brother     Allergies  Allergen Reactions  . Atenolol     REACTION: bradycardia    Current Outpatient Prescriptions on File Prior to Visit  Medication Sig Dispense Refill  . amLODipine (NORVASC) 10 MG tablet Take 1 tablet (10 mg total) by mouth daily.  90 tablet  1  . Ascorbic Acid (VITAMIN C) 1000 MG tablet Take 1,000 mg by mouth daily.      Marland Kitchen aspirin 81 MG tablet Take 1 tab daily      . losartan (COZAAR) 100 MG tablet Take 1 tablet (100 mg total) by mouth daily.  90 tablet  3  . niacin 250 MG tablet Take 500 mg by mouth daily.       . NON FORMULARY daily. eyevitexra        . pravastatin (PRAVACHOL) 80 MG tablet Take 1 tablet (80 mg total) by mouth every evening.  90 tablet  3     patient denies chest pain, shortness of breath, orthopnea. Denies lower extremity edema, abdominal pain, change in appetite, change in bowel movements. Patient denies rashes, musculoskeletal complaints. No other specific complaints in a complete review of systems.   BP 136/84  Pulse 68  Temp 98 F (36.7 C) (Oral)  Ht 6' (1.829 m)  Wt 234 lb (106.142 kg)  BMI 31.74 kg/m2  well-developed well-nourished male in no acute distress. HEENT exam atraumatic, normocephalic, neck supple without jugular venous distention. Chest clear to auscultation cardiac exam S1-S2 are regular. Abdominal exam overweight with bowel sounds, soft and nontender. Extremities no edema. Neurologic exam is alert with a normal gait.

## 2011-11-18 NOTE — Assessment & Plan Note (Addendum)
F/u labs Reviewed labs from june

## 2011-11-18 NOTE — Assessment & Plan Note (Signed)
Check labs next visit Reasonable control

## 2012-01-21 ENCOUNTER — Ambulatory Visit (INDEPENDENT_AMBULATORY_CARE_PROVIDER_SITE_OTHER): Payer: Medicare PPO

## 2012-01-21 DIAGNOSIS — Z23 Encounter for immunization: Secondary | ICD-10-CM

## 2012-04-10 ENCOUNTER — Other Ambulatory Visit: Payer: Self-pay | Admitting: Internal Medicine

## 2012-05-20 ENCOUNTER — Ambulatory Visit: Payer: Medicare PPO | Admitting: Internal Medicine

## 2012-05-25 ENCOUNTER — Ambulatory Visit (INDEPENDENT_AMBULATORY_CARE_PROVIDER_SITE_OTHER): Payer: Medicare PPO | Admitting: Internal Medicine

## 2012-05-25 ENCOUNTER — Encounter: Payer: Self-pay | Admitting: Internal Medicine

## 2012-05-25 VITALS — BP 162/100 | HR 64 | Temp 97.9°F | Wt 238.0 lb

## 2012-05-25 DIAGNOSIS — N259 Disorder resulting from impaired renal tubular function, unspecified: Secondary | ICD-10-CM

## 2012-05-25 DIAGNOSIS — E785 Hyperlipidemia, unspecified: Secondary | ICD-10-CM

## 2012-05-25 DIAGNOSIS — I2510 Atherosclerotic heart disease of native coronary artery without angina pectoris: Secondary | ICD-10-CM

## 2012-05-25 DIAGNOSIS — I10 Essential (primary) hypertension: Secondary | ICD-10-CM

## 2012-05-25 LAB — LIPID PANEL
Cholesterol: 127 mg/dL (ref 0–200)
Triglycerides: 162 mg/dL — ABNORMAL HIGH (ref 0.0–149.0)

## 2012-05-25 LAB — HEPATIC FUNCTION PANEL
Albumin: 3.5 g/dL (ref 3.5–5.2)
Total Protein: 6.7 g/dL (ref 6.0–8.3)

## 2012-05-25 LAB — TSH: TSH: 4.63 u[IU]/mL (ref 0.35–5.50)

## 2012-05-25 LAB — BASIC METABOLIC PANEL
BUN: 29 mg/dL — ABNORMAL HIGH (ref 6–23)
CO2: 23 mEq/L (ref 19–32)
Calcium: 8.8 mg/dL (ref 8.4–10.5)
Creatinine, Ser: 2 mg/dL — ABNORMAL HIGH (ref 0.4–1.5)
Glucose, Bld: 98 mg/dL (ref 70–99)

## 2012-05-25 MED ORDER — LOSARTAN POTASSIUM-HCTZ 100-12.5 MG PO TABS: 1.0000 | ORAL_TABLET | Freq: Every day | ORAL | Status: DC

## 2012-05-25 NOTE — Assessment & Plan Note (Signed)
S/p stent He is off plavix Need to continue to address risk factors- Advised daily exercise and aggressive weight loss.

## 2012-05-25 NOTE — Assessment & Plan Note (Signed)
Not monitored at home. He admits to medication compliance Will add hctz to losartan (note renal insufficiency)

## 2012-05-25 NOTE — Assessment & Plan Note (Signed)
Check labs today Needs better control of bp. bp check in one month

## 2012-05-25 NOTE — Assessment & Plan Note (Signed)
Check labs today Continue meds 

## 2012-05-25 NOTE — Progress Notes (Signed)
Patient ID: Glen [name], male   DOB: 09-14-1940, 72 y.o.   MRN: [id number] Cad- no sxs He is not following a low cholesterol diet, he is not trying to lose weight and he is not exercising  htn-- tolerating meds  Lipids- he is taking meds as instructed  Renal insuf--  Lab Results  Component Value Date   CREATININE 1.6* 05/15/2011  he needs f/u  Past Medical History  Diagnosis Date  . CAD (coronary artery disease) 2004    s/p MI, s/p stents LCX and prox LAD   . Hypertension   . Chronic kidney disease     renal failure  . Hyperlipidemia   . Hx of colonoscopy 05/15/2005  . Obesity   . Bradycardia     requiring discontinuation of b-blocker  . Renal insufficiency   . Myocardial infarction     08/02/2002    History   Social History  . Marital Status: Married    Spouse Name: N/A    Number of Children: N/A  . Years of Education: N/A   Occupational History  . Not on file.   Social History Main Topics  . Smoking status: Never Smoker   . Smokeless tobacco: Never Used  . Alcohol Use: 0.6 oz/week    1 Glasses of wine per week  . Drug Use: Not on file  . Sexually Active: Not on file   Other Topics Concern  . Not on file   Social History Narrative  . No narrative on file    Past Surgical History  Procedure Laterality Date  . Cardiac catheterization    . Cardiac catheterization  2004    2 stents placed  . Colonoscopy    . Polypectomy      Family History  Problem Relation Age of Onset  . Aneurysm Father     brain  . Hypertension Father   . Heart attack Brother   . Alcohol abuse Brother     Allergies  Allergen Reactions  . Atenolol     REACTION: bradycardia    Current Outpatient Prescriptions on File Prior to Visit  Medication Sig Dispense Refill  . amLODipine (NORVASC) 10 MG tablet TAKE 1 TABLET EVERY DAY  90 tablet  PRN  . aspirin 81 MG tablet Take 1 tab daily      . losartan (COZAAR) 100 MG tablet Take 1 tablet (100 mg total) by mouth daily.  90  tablet  3  . niacin 250 MG tablet Take 500 mg by mouth daily.       . pravastatin (PRAVACHOL) 80 MG tablet Take 1 tablet (80 mg total) by mouth every evening.  90 tablet  3   No current facility-administered medications on file prior to visit.     patient denies chest pain, shortness of breath, orthopnea. Denies lower extremity edema, abdominal pain, change in appetite, change in bowel movements. Patient denies rashes, musculoskeletal complaints. No other specific complaints in a complete review of systems.   BP 162/100  Pulse 64  Temp(Src) 97.9 F (36.6 C) (Oral)  Wt 238 lb (107.956 kg)  BMI 32.27 kg/m2  well-developed well-nourished male in no acute distress. HEENT exam atraumatic, normocephalic, neck supple without jugular venous distention. Chest clear to auscultation cardiac exam S1-S2 are regular. Abdominal exam overweight with bowel sounds, soft and nontender. Extremities no edema. Neurologic exam is alert with a normal gait.

## 2012-06-25 ENCOUNTER — Other Ambulatory Visit: Payer: Self-pay

## 2012-06-25 MED ORDER — PRAVASTATIN SODIUM 80 MG PO TABS: 80.0000 mg | ORAL_TABLET | Freq: Every evening | ORAL | Status: DC

## 2012-06-25 NOTE — Telephone Encounter (Signed)
..   Requested Prescriptions   Signed Prescriptions Disp Refills  . pravastatin (PRAVACHOL) 80 MG tablet 90 tablet 1    Sig: Take 1 tablet (80 mg total) by mouth every evening.    Authorizing Provider: Rollene Rotunda    Ordering User: Christella Hartigan, Rhia Blatchford Judie Petit

## 2012-06-29 ENCOUNTER — Ambulatory Visit (INDEPENDENT_AMBULATORY_CARE_PROVIDER_SITE_OTHER): Payer: Medicare PPO | Admitting: Internal Medicine

## 2012-06-29 VITALS — BP 130/80

## 2012-06-29 DIAGNOSIS — I10 Essential (primary) hypertension: Secondary | ICD-10-CM

## 2012-06-29 LAB — BASIC METABOLIC PANEL
BUN: 35 mg/dL — ABNORMAL HIGH (ref 6–23)
Calcium: 8.9 mg/dL (ref 8.4–10.5)
Creatinine, Ser: 2 mg/dL — ABNORMAL HIGH (ref 0.4–1.5)
GFR: 34.56 mL/min — ABNORMAL LOW (ref >60.00)

## 2012-06-29 NOTE — Addendum Note (Signed)
Addended by: Bonnye Fava on: 06/29/2012 10:23 AM   Modules accepted: Orders

## 2012-07-31 ENCOUNTER — Other Ambulatory Visit (INDEPENDENT_AMBULATORY_CARE_PROVIDER_SITE_OTHER): Payer: Medicare PPO

## 2012-07-31 DIAGNOSIS — I10 Essential (primary) hypertension: Secondary | ICD-10-CM

## 2012-07-31 LAB — BASIC METABOLIC PANEL
Calcium: 9.2 mg/dL (ref 8.4–10.5)
GFR: 33.41 mL/min — ABNORMAL LOW (ref >60.00)
Sodium: 138 mEq/L (ref 135–145)

## 2012-08-07 NOTE — Progress Notes (Signed)
Called and spoke with pt and pt is aware.  

## 2012-11-17 ENCOUNTER — Other Ambulatory Visit: Payer: Self-pay

## 2012-11-17 DIAGNOSIS — E785 Hyperlipidemia, unspecified: Secondary | ICD-10-CM

## 2012-11-17 MED ORDER — PRAVASTATIN SODIUM 80 MG PO TABS: 80.0000 mg | ORAL_TABLET | Freq: Every evening | ORAL | Status: DC

## 2012-11-23 ENCOUNTER — Ambulatory Visit: Payer: Medicare PPO | Admitting: Internal Medicine

## 2012-12-01 ENCOUNTER — Ambulatory Visit (INDEPENDENT_AMBULATORY_CARE_PROVIDER_SITE_OTHER): Payer: Medicare PPO | Admitting: Internal Medicine

## 2012-12-01 ENCOUNTER — Encounter: Payer: Self-pay | Admitting: Internal Medicine

## 2012-12-01 VITALS — BP 132/80 | HR 60 | Temp 98.2°F | Wt 238.0 lb

## 2012-12-01 DIAGNOSIS — I2510 Atherosclerotic heart disease of native coronary artery without angina pectoris: Secondary | ICD-10-CM

## 2012-12-01 DIAGNOSIS — E669 Obesity, unspecified: Secondary | ICD-10-CM

## 2012-12-01 DIAGNOSIS — E785 Hyperlipidemia, unspecified: Secondary | ICD-10-CM

## 2012-12-01 DIAGNOSIS — I10 Essential (primary) hypertension: Secondary | ICD-10-CM

## 2012-12-01 DIAGNOSIS — N259 Disorder resulting from impaired renal tubular function, unspecified: Secondary | ICD-10-CM

## 2012-12-01 LAB — BASIC METABOLIC PANEL
Chloride: 107 mEq/L (ref 96–112)
Potassium: 4.1 mEq/L (ref 3.5–5.1)

## 2012-12-01 NOTE — Assessment & Plan Note (Signed)
Lab Results  Component Value Date   CREATININE 2.1* 07/31/2012   Will check labs today

## 2012-12-01 NOTE — Assessment & Plan Note (Signed)
Fair control Will continue to monitor

## 2012-12-01 NOTE — Progress Notes (Signed)
CAD- no sxs. Tolerating meds Lipids- tolerating meds without difficulty htn-- no home bps Renal Insufficiency- needs f/u  Past Medical History  Diagnosis Date  . CAD (coronary artery disease) 2004    s/p MI, s/p stents LCX and prox LAD   . Hypertension   . Chronic kidney disease     renal failure  . Hyperlipidemia   . Hx of colonoscopy 05/15/2005  . Obesity   . Bradycardia     requiring discontinuation of b-blocker  . Renal insufficiency   . Myocardial infarction     08/02/2002    History   Social History  . Marital Status: Married    Spouse Name: N/A    Number of Children: N/A  . Years of Education: N/A   Occupational History  . Not on file.   Social History Main Topics  . Smoking status: Never Smoker   . Smokeless tobacco: Never Used  . Alcohol Use: 0.6 oz/week    1 Glasses of wine per week  . Drug Use: Not on file  . Sexual Activity: Not on file   Other Topics Concern  . Not on file   Social History Narrative  . No narrative on file    Past Surgical History  Procedure Laterality Date  . Cardiac catheterization    . Cardiac catheterization  2004    2 stents placed  . Colonoscopy    . Polypectomy      Family History  Problem Relation Age of Onset  . Aneurysm Father     brain  . Hypertension Father   . Heart attack Brother   . Alcohol abuse Brother     Allergies  Allergen Reactions  . Atenolol     REACTION: bradycardia    Current Outpatient Prescriptions on File Prior to Visit  Medication Sig Dispense Refill  . amLODipine (NORVASC) 10 MG tablet TAKE 1 TABLET EVERY DAY  90 tablet  PRN  . aspirin 81 MG tablet Take 1 tab daily      . losartan-hydrochlorothiazide (HYZAAR) 100-12.5 MG per tablet Take 1 tablet by mouth daily.  90 tablet  3  . niacin 250 MG tablet Take 500 mg by mouth daily.       . pravastatin (PRAVACHOL) 80 MG tablet Take 1 tablet (80 mg total) by mouth every evening.  90 tablet  1   No current facility-administered medications  on file prior to visit.     patient denies chest pain, shortness of breath, orthopnea. Denies lower extremity edema, abdominal pain, change in appetite, change in bowel movements. Patient denies rashes, musculoskeletal complaints. No other specific complaints in a complete review of systems.   BP 142/84  Pulse 60  Temp(Src) 98.2 F (36.8 C) (Oral)  Wt 238 lb (107.956 kg)  BMI 32.27 kg/m2  well-developed well-nourished male in no acute distress. HEENT exam atraumatic, normocephalic, neck supple without jugular venous distention. Chest clear to auscultation cardiac exam S1-S2 are regular. Abdominal exam overweight with bowel sounds, soft and nontender. Extremities no edema. Neurologic exam is alert with a normal gait.

## 2012-12-01 NOTE — Assessment & Plan Note (Signed)
Lipid Panel     Component Value Date/Time   CHOL 127 05/25/2012 0927   TRIG 162.0* 05/25/2012 0927   HDL 25.90* 05/25/2012 0927   CHOLHDL 5 05/25/2012 0927   VLDL 32.4 05/25/2012 0927   LDLCALC 69 05/25/2012 0927   Previously controlled Will recheck in 6 months

## 2012-12-01 NOTE — Assessment & Plan Note (Signed)
He needs to lose weight Goal bmi<25

## 2012-12-04 ENCOUNTER — Encounter: Payer: Self-pay | Admitting: *Deleted

## 2012-12-16 ENCOUNTER — Ambulatory Visit (INDEPENDENT_AMBULATORY_CARE_PROVIDER_SITE_OTHER): Payer: Medicare PPO

## 2012-12-16 DIAGNOSIS — Z23 Encounter for immunization: Secondary | ICD-10-CM

## 2013-01-18 ENCOUNTER — Ambulatory Visit (INDEPENDENT_AMBULATORY_CARE_PROVIDER_SITE_OTHER): Payer: Medicare PPO | Admitting: Cardiology

## 2013-01-18 ENCOUNTER — Encounter: Payer: Self-pay | Admitting: Cardiology

## 2013-01-18 VITALS — BP 137/80 | HR 62 | Ht 73.0 in | Wt 237.0 lb

## 2013-01-18 DIAGNOSIS — I2510 Atherosclerotic heart disease of native coronary artery without angina pectoris: Secondary | ICD-10-CM

## 2013-01-18 DIAGNOSIS — I10 Essential (primary) hypertension: Secondary | ICD-10-CM

## 2013-01-18 DIAGNOSIS — E785 Hyperlipidemia, unspecified: Secondary | ICD-10-CM

## 2013-01-18 NOTE — Patient Instructions (Signed)
Please stop Niacin and Vitamin C Continue all other medications as listed  Follow up in 1 year with Dr Antoine Poche.  You will receive a letter in the mail 2 months before you are due.  Please call us when you receive this letter to schedule your follow up appointment.

## 2013-01-18 NOTE — Progress Notes (Signed)
HPI The patient presents for follow up of CAD. He had a PCI years ago and since has had no new cardiovascular symptoms. We did try to do an exercise treadmill test last year and it was submaximal but there was no evidence of ischemia.  The patient denies any new symptoms such as chest discomfort, neck or arm discomfort. There has been no new shortness of breath, PND or orthopnea. There have been no reported palpitations, presyncope or syncope.  He does not exercise routinely but he does some yard work in the summer.  Allergies  Allergen Reactions  . Atenolol     REACTION: bradycardia    Current Outpatient Prescriptions  Medication Sig Dispense Refill  . amLODipine (NORVASC) 10 MG tablet TAKE 1 TABLET EVERY DAY  90 tablet  PRN  . Ascorbic Acid (VITAMIN C) 1000 MG tablet Take 1,000 mg by mouth daily.      Marland Kitchen aspirin 81 MG tablet Take 1 tab daily      . losartan-hydrochlorothiazide (HYZAAR) 100-12.5 MG per tablet Take 1 tablet by mouth daily.  90 tablet  3  . multivitamin-lutein (OCUVITE-LUTEIN) CAPS capsule Take 1 capsule by mouth daily.      . niacin 250 MG tablet Take 500 mg by mouth daily.       . pravastatin (PRAVACHOL) 80 MG tablet Take 1 tablet (80 mg total) by mouth every evening.  90 tablet  1   No current facility-administered medications for this visit.    Past Medical History  Diagnosis Date  . CAD (coronary artery disease) 2004    s/p MI, s/p stents LCX and prox LAD   . Hypertension   . Chronic kidney disease     renal failure  . Hyperlipidemia   . Hx of colonoscopy 05/15/2005  . Obesity   . Bradycardia     requiring discontinuation of b-blocker  . Renal insufficiency   . Myocardial infarction     08/02/2002    Past Surgical History  Procedure Laterality Date  . Cardiac catheterization    . Cardiac catheterization  2004    2 stents placed  . Colonoscopy    . Polypectomy     ROS:  As stated in the HPI and negative for all other systems.  PHYSICAL EXAM BP  137/80  Pulse 62  Ht 6\' 1"  (1.854 m)  Wt 237 lb (107.502 kg)  BMI 31.27 kg/m2 GENERAL:  Well appearing NECK:  No jugular venous distention, waveform within normal limits, carotid upstroke brisk and symmetric, no bruits, no thyromegaly LUNGS:  Clear to auscultation bilaterally BACK:  No CVA tenderness CHEST:  Unremarkable HEART:  PMI not displaced or sustained,S1 and S2 within normal limits, no S3, no S4, no clicks, no rubs, no murmurs ABD:  Flat, positive bowel sounds normal in frequency in pitch, no bruits, no rebound, no guarding, no midline pulsatile mass, no hepatomegaly, no splenomegaly EXT:  2 plus pulses throughout, no edema, no cyanosis no clubbing  EKG:  Sinus rhythm, rate 62, left axis deviation, intervals within normal limits, no acute ST-T wave changes.  pvc  01/18/2013   ASSESSMENT AND PLAN  CAD:  The patient has no new sypmtoms.  No further cardiovascular testing is indicated.  We will continue with aggressive risk reduction and meds as listed.  HTN:  The blood pressure is at target. No change in medications is indicated. We will continue with therapeutic lifestyle changes (TLC).  HYPERLIPIDEMIA:  I do not see further indication for niacin as  we are using only for low HDL.  He will also stop the vit C

## 2013-02-15 ENCOUNTER — Other Ambulatory Visit: Payer: Self-pay | Admitting: Internal Medicine

## 2013-03-08 ENCOUNTER — Telehealth: Payer: Self-pay | Admitting: Internal Medicine

## 2013-03-08 DIAGNOSIS — I10 Essential (primary) hypertension: Secondary | ICD-10-CM

## 2013-03-08 MED ORDER — LOSARTAN POTASSIUM-HCTZ 100-12.5 MG PO TABS: 1.0000 | ORAL_TABLET | Freq: Every day | ORAL | Status: DC

## 2013-03-08 NOTE — Telephone Encounter (Signed)
The wrong RX was called in on 02/15/13, it was Cozaar.  Pt needs RX re-fill for 90 days of losartan-hydrochlorothiazide (HYZAAR) 100-12.5 MG per tablet sent to [facility].

## 2013-03-08 NOTE — Telephone Encounter (Signed)
Refill sent in electronically 

## 2013-03-18 ENCOUNTER — Other Ambulatory Visit: Payer: Self-pay | Admitting: *Deleted

## 2013-05-05 ENCOUNTER — Other Ambulatory Visit: Payer: Self-pay

## 2013-05-05 DIAGNOSIS — E785 Hyperlipidemia, unspecified: Secondary | ICD-10-CM

## 2013-05-05 MED ORDER — PRAVASTATIN SODIUM 80 MG PO TABS: 80.0000 mg | ORAL_TABLET | Freq: Every evening | ORAL | Status: DC

## 2013-06-07 ENCOUNTER — Encounter: Payer: Self-pay | Admitting: Internal Medicine

## 2013-06-07 ENCOUNTER — Ambulatory Visit (INDEPENDENT_AMBULATORY_CARE_PROVIDER_SITE_OTHER): Payer: Medicare HMO | Admitting: Internal Medicine

## 2013-06-07 VITALS — BP 122/82 | HR 84 | Temp 99.1°F | Ht 73.0 in | Wt 236.0 lb

## 2013-06-07 DIAGNOSIS — N259 Disorder resulting from impaired renal tubular function, unspecified: Secondary | ICD-10-CM

## 2013-06-07 DIAGNOSIS — I2510 Atherosclerotic heart disease of native coronary artery without angina pectoris: Secondary | ICD-10-CM

## 2013-06-07 LAB — BASIC METABOLIC PANEL
BUN: 41 mg/dL — AB (ref 6–23)
CO2: 26 meq/L (ref 19–32)
Calcium: 8.7 mg/dL (ref 8.4–10.5)
Chloride: 103 mEq/L (ref 96–112)
Creatinine, Ser: 2.5 mg/dL — ABNORMAL HIGH (ref 0.4–1.5)
GFR: 27.75 mL/min — AB (ref >60.00)
GLUCOSE: 106 mg/dL — AB (ref 70–99)
POTASSIUM: 3.4 meq/L — AB (ref 3.5–5.1)
Sodium: 139 mEq/L (ref 135–145)

## 2013-06-07 LAB — HEPATIC FUNCTION PANEL
ALT: 20 U/L (ref 0–53)
AST: 19 U/L (ref 0–37)
Albumin: 3.8 g/dL (ref 3.5–5.2)
Alkaline Phosphatase: 77 U/L (ref 39–117)
BILIRUBIN DIRECT: 0.1 mg/dL (ref 0.0–0.3)
BILIRUBIN TOTAL: 1 mg/dL (ref 0.3–1.2)
Total Protein: 7 g/dL (ref 6.0–8.3)

## 2013-06-07 NOTE — Progress Notes (Signed)
Pre visit review using our clinic review tool, if applicable. No additional management support is needed unless otherwise documented below in the visit note. 

## 2013-06-07 NOTE — Progress Notes (Signed)
Glen [name] comes in for followup. Glen [name] has known coronary artery disease. In 2004 he had a PTCA and stent placement of the proximal LAD. Also a PTCA and stent placement of the first marginal branch of the left circumflex artery. The Glen [name] does not eat a heart healthy diet nor does she exercise regularly.  Glen [name] has a history of hypertension, renal insufficiency and hyperlipidemia. He does take his medications as prescribed.  Past Medical History  Diagnosis Date  . CAD (coronary artery disease) 2004    s/p MI, s/p stents LCX and prox LAD   . Hypertension   . Chronic kidney disease     renal failure  . Hyperlipidemia   . Hx of colonoscopy 05/15/2005  . Obesity   . Bradycardia     requiring discontinuation of b-blocker  . Renal insufficiency   . Myocardial infarction     08/02/2002    History   Social History  . Marital Status: Married    Spouse Name: N/A    Number of Children: N/A  . Years of Education: N/A   Occupational History  . Not on file.   Social History Main Topics  . Smoking status: Never Smoker   . Smokeless tobacco: Never Used  . Alcohol Use: 0.6 oz/week    1 Glasses of wine per week  . Drug Use: Not on file  . Sexual Activity: Not on file   Other Topics Concern  . Not on file   Social History Narrative  . No narrative on file    Past Surgical History  Procedure Laterality Date  . Cardiac catheterization    . Cardiac catheterization  2004    2 stents placed  . Colonoscopy    . Polypectomy      Family History  Problem Relation Age of Onset  . Aneurysm Father     brain  . Hypertension Father   . Heart attack Brother   . Alcohol abuse Brother     Allergies  Allergen Reactions  . Atenolol     REACTION: bradycardia    Current Outpatient Prescriptions on File Prior to Visit  Medication Sig Dispense Refill  . amLODipine (NORVASC) 10 MG tablet TAKE 1 TABLET EVERY DAY  90 tablet  3  . aspirin 81 MG tablet Take 1 tab daily      . losartan  (COZAAR) 100 MG tablet TAKE 1 TABLET (100 MG TOTAL) BY MOUTH DAILY.  90 tablet  3  . losartan-hydrochlorothiazide (HYZAAR) 100-12.5 MG per tablet Take 1 tablet by mouth daily.  90 tablet  3  . multivitamin-lutein (OCUVITE-LUTEIN) CAPS capsule Take 1 capsule by mouth daily.      . pravastatin (PRAVACHOL) 80 MG tablet Take 1 tablet (80 mg total) by mouth every evening.  90 tablet  1   No current facility-administered medications on file prior to visit.     Glen [name] denies chest pain, shortness of breath, orthopnea. Denies lower extremity edema, abdominal pain, change in appetite, change in bowel movements. Glen [name] denies rashes, musculoskeletal complaints. No other specific complaints in a complete review of systems.   Reviewed vitals  well-developed well-nourished male in no acute distress. HEENT exam atraumatic, normocephalic, neck supple without jugular venous distention. Chest clear to auscultation cardiac exam S1-S2 are regular. Abdominal exam overweight with bowel sounds, soft and nontender. Extremities no edema. Neurologic exam is alert with a normal gait.

## 2013-06-07 NOTE — Assessment & Plan Note (Signed)
Lab Results  Component Value Date   CREATININE 2.0* 12/01/2012   I'll check laboratory work today.

## 2013-06-07 NOTE — Assessment & Plan Note (Signed)
I discussed the need for regular exercise. He should eat a heart healthy diet and concentrate on weight loss. Will schedule laboratory work today.

## 2013-06-15 ENCOUNTER — Other Ambulatory Visit: Payer: Self-pay | Admitting: Internal Medicine

## 2013-06-15 DIAGNOSIS — N259 Disorder resulting from impaired renal tubular function, unspecified: Secondary | ICD-10-CM

## 2013-10-05 ENCOUNTER — Ambulatory Visit (INDEPENDENT_AMBULATORY_CARE_PROVIDER_SITE_OTHER): Payer: Medicare HMO | Admitting: *Deleted

## 2013-10-05 DIAGNOSIS — Z23 Encounter for immunization: Secondary | ICD-10-CM

## 2013-11-04 ENCOUNTER — Telehealth: Payer: Self-pay | Admitting: Internal Medicine

## 2013-11-04 NOTE — Telephone Encounter (Signed)
Former [facility] patient whose son is a current patient of yours (Jhaden Anne Ng.), pt would like to transfer to you since you see their son.  Please advise if ok to switch.

## 2013-11-04 NOTE — Telephone Encounter (Signed)
OK 

## 2013-11-09 NOTE — Telephone Encounter (Signed)
Lm w/ son to return my call.

## 2013-11-12 ENCOUNTER — Telehealth: Payer: Self-pay | Admitting: Internal Medicine

## 2013-11-12 NOTE — Telephone Encounter (Signed)
Authorization # [id number]- start 01/18/2014 - 07/20/2014 done for pt appt scheduled for 01-18-2014 [place] at [place] [address] [place]  [place], [place] [address] [phone]

## 2013-11-12 NOTE — Telephone Encounter (Signed)
Type of Insurance: humana  Do we have a copy of Insurance Card on File? yes  Patient's PCP: dr swords  PCP's NPI: [id number]  Treating Provider: dr hochrein  treating Provider's NPI:  Treating Provider's Phone Number:[phone]  Treating Provider's Fax [phone]  Reason for Visit (diagnosis):CAD PT HAS APPT ON 01-18-14

## 2013-11-25 ENCOUNTER — Telehealth: Payer: Self-pay | Admitting: Family Medicine

## 2013-11-25 ENCOUNTER — Telehealth: Payer: Self-pay | Admitting: Cardiology

## 2013-11-25 DIAGNOSIS — E785 Hyperlipidemia, unspecified: Secondary | ICD-10-CM

## 2013-11-25 MED ORDER — PRAVASTATIN SODIUM 80 MG PO TABS: 80.0000 mg | ORAL_TABLET | Freq: Every evening | ORAL | Status: DC

## 2013-11-25 NOTE — Telephone Encounter (Signed)
Rx refill sent to patient pharmacy   

## 2013-11-25 NOTE — Telephone Encounter (Signed)
fyi   A referral was put in on march  For - Unspecified disorder resulting from impaired renal function Procedure: REF45 - AMB REFERRAL TO NEPHROLOGY Start: 06/15/2013  Patient decline to be scheduled  Per [facility]  Contact pt on 8-6- 2015 and 11-15-2013 to scheduled with pt - pt declined appt states he know longer sees Dr swords and he's a patient of Dr. Elease Hashimoto and he was doing fine and did not want the referral appt to be  made  Per [place] at  [facility],  [address] [place], [place], [address] [phone]

## 2013-11-25 NOTE — Telephone Encounter (Signed)
Pt still have not received his Pravastatin call to [facility] or [facility]. The fax number [phone].

## 2013-11-26 ENCOUNTER — Other Ambulatory Visit: Payer: Self-pay | Admitting: *Deleted

## 2013-11-26 DIAGNOSIS — E785 Hyperlipidemia, unspecified: Secondary | ICD-10-CM

## 2013-11-26 MED ORDER — PRAVASTATIN SODIUM 80 MG PO TABS: 80.0000 mg | ORAL_TABLET | Freq: Every evening | ORAL | Status: DC

## 2014-01-18 ENCOUNTER — Encounter: Payer: Self-pay | Admitting: Cardiology

## 2014-01-18 ENCOUNTER — Ambulatory Visit (INDEPENDENT_AMBULATORY_CARE_PROVIDER_SITE_OTHER): Payer: Commercial Managed Care - HMO | Admitting: Cardiology

## 2014-01-18 VITALS — BP 142/92 | HR 70 | Ht 73.0 in | Wt 238.0 lb

## 2014-01-18 DIAGNOSIS — I2510 Atherosclerotic heart disease of native coronary artery without angina pectoris: Secondary | ICD-10-CM

## 2014-01-18 DIAGNOSIS — I10 Essential (primary) hypertension: Secondary | ICD-10-CM

## 2014-01-18 NOTE — Patient Instructions (Signed)
Your physician recommends that you schedule a follow-up appointment in: one year with Dr. Hochrein  

## 2014-01-18 NOTE — Progress Notes (Signed)
HPI The patient presents for follow up of CAD. He had a PCI years ago and since has had no new cardiovascular symptoms. We did try to do an exercise treadmill test two years ago and it was submaximal but there was no evidence of ischemia.  The patient denies any new symptoms such as chest discomfort, neck or arm discomfort. There has been no new shortness of breath, PND or orthopnea. There have been no reported palpitations, presyncope or syncope.  He does not exercise routinely but he does some yard work in the summer still.  Allergies  Allergen Reactions  . Atenolol     REACTION: bradycardia    Current Outpatient Prescriptions  Medication Sig Dispense Refill  . amLODipine (NORVASC) 10 MG tablet TAKE 1 TABLET EVERY DAY  90 tablet  3  . aspirin 81 MG tablet Take 1 tab daily      . losartan-hydrochlorothiazide (HYZAAR) 100-12.5 MG per tablet Take 1 tablet by mouth daily.  90 tablet  3  . multivitamin-lutein (OCUVITE-LUTEIN) CAPS capsule Take 1 capsule by mouth daily.      . pravastatin (PRAVACHOL) 80 MG tablet Take 1 tablet (80 mg total) by mouth every evening.  90 tablet  3   No current facility-administered medications for this visit.    Past Medical History  Diagnosis Date  . CAD (coronary artery disease) 2004    s/p MI, s/p stents LCX and prox LAD   . Hypertension   . Chronic kidney disease     renal failure  . Hyperlipidemia   . Hx of colonoscopy 05/15/2005  . Obesity   . Bradycardia     requiring discontinuation of b-blocker  . Renal insufficiency   . Myocardial infarction     08/02/2002    Past Surgical History  Procedure Laterality Date  . Cardiac catheterization    . Cardiac catheterization  2004    2 stents placed  . Colonoscopy    . Polypectomy     ROS:  As stated in the HPI and negative for all other systems.  PHYSICAL EXAM BP 142/92  Pulse 70  Ht 6\' 1"  (1.854 m)  Wt 238 lb (107.956 kg)  BMI 31.41 kg/m2 GENERAL:  Well appearing NECK:  No jugular venous  distention, waveform within normal limits, carotid upstroke brisk and symmetric, no bruits, no thyromegaly LUNGS:  Clear to auscultation bilaterally CHEST:  Unremarkable HEART:  PMI not displaced or sustained,S1 and S2 within normal limits, no S3, no S4, no clicks, no rubs, no murmurs ABD:  Flat, positive bowel sounds normal in frequency in pitch, no bruits, no rebound, no guarding, no midline pulsatile mass, no hepatomegaly, no splenomegaly EXT:  2 plus pulses throughout, no edema, no cyanosis no clubbing  EKG:  Sinus rhythm, rate 70 left axis deviation, intervals within normal limits, no acute ST-T wave changes.  01/18/2014   ASSESSMENT AND PLAN  CAD:  The patient has no new sypmtoms.  No further cardiovascular testing is indicated.  We will continue with aggressive risk reduction and meds as listed.  HTN:  The blood pressure is at target. No change in medications is indicated. We will continue with therapeutic lifestyle changes (TLC).  HYPERLIPIDEMIA:  I do not see further indication for niacin as we are using only for low HDL.    Lab Results  Component Value Date   CHOL 127 05/25/2012   TRIG 162.0* 05/25/2012   HDL 25.90* 05/25/2012   LDLCALC 69 05/25/2012   LDLDIRECT 66.0 10/05/2007

## 2014-01-21 ENCOUNTER — Ambulatory Visit (INDEPENDENT_AMBULATORY_CARE_PROVIDER_SITE_OTHER): Payer: Medicare HMO

## 2014-01-21 DIAGNOSIS — Z23 Encounter for immunization: Secondary | ICD-10-CM

## 2014-04-09 ENCOUNTER — Other Ambulatory Visit: Payer: Self-pay | Admitting: Internal Medicine

## 2014-06-13 ENCOUNTER — Other Ambulatory Visit: Payer: Self-pay | Admitting: Family Medicine

## 2014-06-13 ENCOUNTER — Other Ambulatory Visit: Payer: Self-pay | Admitting: Internal Medicine

## 2014-06-13 ENCOUNTER — Encounter: Payer: Medicare HMO | Admitting: Internal Medicine

## 2014-06-17 ENCOUNTER — Encounter: Payer: Self-pay | Admitting: Family Medicine

## 2014-06-17 ENCOUNTER — Ambulatory Visit (INDEPENDENT_AMBULATORY_CARE_PROVIDER_SITE_OTHER): Payer: Commercial Managed Care - HMO | Admitting: Family Medicine

## 2014-06-17 VITALS — BP 130/74 | HR 62 | Temp 98.3°F | Ht 72.0 in | Wt 237.0 lb

## 2014-06-17 DIAGNOSIS — Z0000 Encounter for general adult medical examination without abnormal findings: Secondary | ICD-10-CM | POA: Diagnosis not present

## 2014-06-17 LAB — BASIC METABOLIC PANEL
BUN: 36 mg/dL — AB (ref 6–23)
CHLORIDE: 105 meq/L (ref 96–112)
CO2: 26 meq/L (ref 19–32)
Calcium: 9.5 mg/dL (ref 8.4–10.5)
Creatinine, Ser: 2.34 mg/dL — ABNORMAL HIGH (ref 0.40–1.50)
GFR: 29.17 mL/min — ABNORMAL LOW (ref >60.00)
Glucose, Bld: 115 mg/dL — ABNORMAL HIGH (ref 70–99)
Potassium: 4.1 mEq/L (ref 3.5–5.1)
Sodium: 137 mEq/L (ref 135–145)

## 2014-06-17 LAB — CBC WITH DIFFERENTIAL/PLATELET
BASOS PCT: 0.5 % (ref 0.0–3.0)
Basophils Absolute: 0 10*3/uL (ref 0.0–0.1)
Eosinophils Absolute: 1.6 10*3/uL — ABNORMAL HIGH (ref 0.0–0.7)
Eosinophils Relative: 15 % — ABNORMAL HIGH (ref 0.0–5.0)
HCT: 43.9 % (ref 39.0–52.0)
Hemoglobin: 15 g/dL (ref 13.0–17.0)
LYMPHS ABS: 2 10*3/uL (ref 0.7–4.0)
Lymphocytes Relative: 18.8 % (ref 12.0–46.0)
MCHC: 34.2 g/dL (ref 30.0–36.0)
MCV: 88.7 fl (ref 78.0–100.0)
Monocytes Absolute: 0.5 10*3/uL (ref 0.1–1.0)
Monocytes Relative: 4.9 % (ref 3.0–12.0)
NEUTROS ABS: 6.6 10*3/uL (ref 1.4–7.7)
NEUTROS PCT: 60.8 % (ref 43.0–77.0)
PLATELETS: 242 10*3/uL (ref 150.0–400.0)
RBC: 4.95 Mil/uL (ref 4.22–5.81)
RDW: 13.5 % (ref 11.5–15.5)
WBC: 10.8 10*3/uL — AB (ref 4.0–10.5)

## 2014-06-17 LAB — LIPID PANEL
CHOL/HDL RATIO: 4
Cholesterol: 131 mg/dL (ref 0–200)
HDL: 30 mg/dL — ABNORMAL LOW (ref >39.00)
LDL CALC: 66 mg/dL (ref 0–99)
NONHDL: 101
TRIGLYCERIDES: 177 mg/dL — AB (ref 0.0–149.0)
VLDL: 35.4 mg/dL (ref 0.0–40.0)

## 2014-06-17 LAB — HEPATIC FUNCTION PANEL
ALT: 15 U/L (ref 0–53)
AST: 14 U/L (ref 0–37)
Albumin: 4.2 g/dL (ref 3.5–5.2)
Alkaline Phosphatase: 92 U/L (ref 39–117)
BILIRUBIN DIRECT: 0.2 mg/dL (ref 0.0–0.3)
BILIRUBIN TOTAL: 0.6 mg/dL (ref 0.2–1.2)
Total Protein: 7 g/dL (ref 6.0–8.3)

## 2014-06-17 LAB — TSH: TSH: 4.02 u[IU]/mL (ref 0.35–4.50)

## 2014-06-17 NOTE — Patient Instructions (Signed)
Continue with yearly flu vaccine

## 2014-06-17 NOTE — Progress Notes (Signed)
Pre visit review using our clinic review tool, if applicable. No additional management support is needed unless otherwise documented below in the visit note. 

## 2014-06-17 NOTE — Progress Notes (Signed)
Subjective:    Patient ID: Glen [name], male    DOB: February 20, 1941, 74 y.o.   MRN: [id number]  HPI Patient seen for physical exam. He has history of hyperlipidemia, obesity, hypertension, CAD. He's had remote history of angioplasty several years ago. No recent chest pains. Medications reviewed. Compliant with all. He walks some for exercise but no consistent exercise. Colonoscopy up-to-date. Immunizations up-to-date.  Reviewed with no changes:  Past Medical History  Diagnosis Date  . CAD (coronary artery disease) 2004    s/p MI, s/p stents LCX and prox LAD   . Hypertension   . Chronic kidney disease     renal failure  . Hyperlipidemia   . Hx of colonoscopy 05/15/2005  . Obesity   . Bradycardia     requiring discontinuation of b-blocker  . Renal insufficiency   . Myocardial infarction     08/02/2002   Past Surgical History  Procedure Laterality Date  . Cardiac catheterization    . Cardiac catheterization  2004    2 stents placed  . Colonoscopy    . Polypectomy      reports that he has never smoked. He has never used smokeless tobacco. He reports that he drinks about 0.6 oz of alcohol per week. His drug history is not on file. family history includes Alcohol abuse in his brother; Aneurysm in his father; Heart attack in his brother; Hypertension in his father. Allergies  Allergen Reactions  . Atenolol     REACTION: bradycardia      Review of Systems  Constitutional: Negative for fever, activity change, appetite change and fatigue.  HENT: Negative for congestion, ear pain and trouble swallowing.   Eyes: Negative for pain and visual disturbance.  Respiratory: Negative for cough, shortness of breath and wheezing.   Cardiovascular: Negative for chest pain and palpitations.  Gastrointestinal: Negative for nausea, vomiting, abdominal pain, diarrhea, constipation, blood in stool, abdominal distention and rectal pain.  Endocrine: Negative for polydipsia and polyuria.    Genitourinary: Negative for dysuria, hematuria and testicular pain.  Musculoskeletal: Negative for joint swelling and arthralgias.  Skin: Negative for rash.  Neurological: Negative for dizziness, syncope and headaches.  Hematological: Negative for adenopathy.  Psychiatric/Behavioral: Negative for confusion and dysphoric mood.       Objective:   Physical Exam  Constitutional: He is oriented to person, place, and time. He appears well-developed and well-nourished. No distress.  HENT:  Head: Normocephalic and atraumatic.  Right Ear: External ear normal.  Left Ear: External ear normal.  Mouth/Throat: Oropharynx is clear and moist.  Eyes: Conjunctivae and EOM are normal. Pupils are equal, round, and reactive to light.  Neck: Normal range of motion. Neck supple. No thyromegaly present.  Cardiovascular: Normal rate, regular rhythm and normal heart sounds.   No murmur heard. Pulmonary/Chest: No respiratory distress. He has no wheezes. He has no rales.  Abdominal: Soft. Bowel sounds are normal. He exhibits no distension and no mass. There is no tenderness. There is no rebound and no guarding.  Musculoskeletal: He exhibits no edema.  Lymphadenopathy:    He has no cervical adenopathy.  Neurological: He is alert and oriented to person, place, and time. He displays normal reflexes. No cranial nerve deficit.  Skin: No rash noted.  Multiple seborrheic keratoses scattered especially over his trunk and back region  Psychiatric: He has a normal mood and affect.          Assessment & Plan:  Complete physical. Immunizations up-to-date. Obtain screening labs. Continue  yearly flu vaccine. We discussed pros and cons of PSA testing and he elects not to get this at his age.

## 2014-06-20 ENCOUNTER — Telehealth: Payer: Self-pay

## 2014-06-20 MED ORDER — AMLODIPINE BESYLATE 10 MG PO TABS: 10.0000 mg | ORAL_TABLET | Freq: Every day | ORAL | Status: DC

## 2014-06-20 NOTE — Telephone Encounter (Signed)
Rx sent to pharmacy   

## 2014-06-20 NOTE — Telephone Encounter (Signed)
[place] refill request for AMLODIPINE 10MG  TAB

## 2014-10-12 ENCOUNTER — Other Ambulatory Visit: Payer: Self-pay | Admitting: Family Medicine

## 2014-10-12 NOTE — Telephone Encounter (Signed)
Refill for one year 

## 2014-12-06 ENCOUNTER — Other Ambulatory Visit: Payer: Self-pay | Admitting: Cardiology

## 2015-01-23 ENCOUNTER — Ambulatory Visit (INDEPENDENT_AMBULATORY_CARE_PROVIDER_SITE_OTHER): Payer: Commercial Managed Care - HMO | Admitting: Cardiology

## 2015-01-23 ENCOUNTER — Encounter: Payer: Self-pay | Admitting: Cardiology

## 2015-01-23 VITALS — BP 122/86 | HR 59 | Ht 72.0 in | Wt 234.6 lb

## 2015-01-23 DIAGNOSIS — R9431 Abnormal electrocardiogram [ECG] [EKG]: Secondary | ICD-10-CM | POA: Diagnosis not present

## 2015-01-23 DIAGNOSIS — I10 Essential (primary) hypertension: Secondary | ICD-10-CM | POA: Diagnosis not present

## 2015-01-23 DIAGNOSIS — I2510 Atherosclerotic heart disease of native coronary artery without angina pectoris: Secondary | ICD-10-CM | POA: Diagnosis not present

## 2015-01-23 NOTE — Patient Instructions (Signed)

## 2015-01-23 NOTE — Progress Notes (Signed)
HPI The patient presents for follow up of CAD. He had a PCI years ago and since has had no new cardiovascular symptoms. We did try to do an exercise treadmill test three years ago and it was submaximal but there was no evidence of ischemia.  The patient denies any new symptoms such as chest discomfort, neck or arm discomfort. There has been no new shortness of breath, PND or orthopnea. There have been no reported palpitations, presyncope or syncope.  He works hard in his yard and pedals a stationary bike. With this level of activity he denies any symptoms..  Allergies  Allergen Reactions  . Atenolol     REACTION: bradycardia    Current Outpatient Prescriptions  Medication Sig Dispense Refill  . amLODipine (NORVASC) 10 MG tablet Take 1 tablet (10 mg total) by mouth daily. 90 tablet 3  . aspirin 81 MG tablet Take 1 tab daily (Patient taking differently: Take 81 mg by mouth daily. Take 1 tab daily)    . losartan-hydrochlorothiazide (HYZAAR) 100-12.5 MG per tablet TAKE 1 TABLET EVERY DAY (Patient taking differently: TAKE 1 TABLET BY MOUTH DAILY) 90 tablet 3  . multivitamin-lutein (OCUVITE-LUTEIN) CAPS capsule Take 1 capsule by mouth daily.    . pravastatin (PRAVACHOL) 80 MG tablet TAKE 1 TABLET EVERY EVENING 90 tablet 0   No current facility-administered medications for this visit.    Past Medical History  Diagnosis Date  . CAD (coronary artery disease) 2004    s/p MI, s/p stents LCX and prox LAD   . Hypertension   . Chronic kidney disease     renal failure  . Hyperlipidemia   . Hx of colonoscopy 05/15/2005  . Obesity   . Bradycardia     requiring discontinuation of b-blocker  . Renal insufficiency   . Myocardial infarction [facility])     08/02/2002    Past Surgical History  Procedure Laterality Date  . Cardiac catheterization    . Cardiac catheterization  2004    2 stents placed  . Colonoscopy    . Polypectomy     ROS:  As stated in the HPI and negative for all other  systems.  PHYSICAL EXAM BP 122/86 mmHg  Pulse 59  Ht 6' (1.829 m)  Wt 234 lb 9.6 oz (106.414 kg)  BMI 31.81 kg/m2 GENERAL:  Well appearing NECK:  No jugular venous distention, waveform within normal limits, carotid upstroke brisk and symmetric, no bruits, no thyromegaly LUNGS:  Clear to auscultation bilaterally CHEST:  Unremarkable HEART:  PMI not displaced or sustained,S1 and S2 within normal limits, no S3, no S4, no clicks, no rubs, no murmurs ABD:  Flat, positive bowel sounds normal in frequency in pitch, no bruits, no rebound, no guarding, no midline pulsatile mass, no hepatomegaly, no splenomegaly EXT:  2 plus pulses throughout, no edema, no cyanosis no clubbing NEURO:  Nonfocal  EKG:  Sinus rhythm, rate 59 inferior infarct, lateral infarct and deep inferolateral T wave inversions on this since his previous EKG  01/23/2015   ASSESSMENT AND PLAN  CAD:  The patient has no new sypmtoms.  However, he has a markedly abnormal EKG which would suggest an inferolateral MI interval since his previous EKG. Given this and going to start with an echocardiogram. If he does have a reduced ejection fraction or significant wall motion abnormalities I will consider further testing to be invasive versus noninvasive.  HTN:  The blood pressure is at target. No change in medications is indicated. We will continue with  therapeutic lifestyle changes (TLC).  HYPERLIPIDEMIA:  His LDL was 66 this spring.  HDL was 30.  No change in therapy is indicated.

## 2015-01-31 ENCOUNTER — Other Ambulatory Visit: Payer: Self-pay

## 2015-01-31 ENCOUNTER — Ambulatory Visit (HOSPITAL_COMMUNITY): Payer: Commercial Managed Care - HMO | Attending: Cardiology

## 2015-01-31 DIAGNOSIS — I517 Cardiomegaly: Secondary | ICD-10-CM | POA: Diagnosis not present

## 2015-01-31 DIAGNOSIS — R9431 Abnormal electrocardiogram [ECG] [EKG]: Secondary | ICD-10-CM

## 2015-01-31 DIAGNOSIS — I10 Essential (primary) hypertension: Secondary | ICD-10-CM | POA: Insufficient documentation

## 2015-01-31 DIAGNOSIS — E669 Obesity, unspecified: Secondary | ICD-10-CM | POA: Insufficient documentation

## 2015-01-31 DIAGNOSIS — Z6831 Body mass index (BMI) 31.0-31.9, adult: Secondary | ICD-10-CM | POA: Diagnosis not present

## 2015-01-31 DIAGNOSIS — E785 Hyperlipidemia, unspecified: Secondary | ICD-10-CM | POA: Diagnosis not present

## 2015-01-31 DIAGNOSIS — I5189 Other ill-defined heart diseases: Secondary | ICD-10-CM | POA: Insufficient documentation

## 2015-01-31 DIAGNOSIS — I358 Other nonrheumatic aortic valve disorders: Secondary | ICD-10-CM | POA: Insufficient documentation

## 2015-02-02 ENCOUNTER — Ambulatory Visit (INDEPENDENT_AMBULATORY_CARE_PROVIDER_SITE_OTHER): Payer: Commercial Managed Care - HMO

## 2015-02-02 DIAGNOSIS — Z23 Encounter for immunization: Secondary | ICD-10-CM

## 2015-04-10 ENCOUNTER — Other Ambulatory Visit: Payer: Self-pay | Admitting: Cardiology

## 2015-04-11 NOTE — Telephone Encounter (Signed)
Rx request sent to pharmacy.  

## 2015-06-19 ENCOUNTER — Encounter: Payer: Commercial Managed Care - HMO | Admitting: Family Medicine

## 2015-08-07 ENCOUNTER — Other Ambulatory Visit: Payer: Self-pay | Admitting: Family Medicine

## 2015-08-17 ENCOUNTER — Encounter: Payer: Commercial Managed Care - HMO | Admitting: Family Medicine

## 2015-08-25 ENCOUNTER — Ambulatory Visit (INDEPENDENT_AMBULATORY_CARE_PROVIDER_SITE_OTHER): Payer: Commercial Managed Care - HMO | Admitting: Family Medicine

## 2015-08-25 VITALS — BP 140/84 | HR 70 | Temp 98.0°F | Ht 72.0 in | Wt 232.0 lb

## 2015-08-25 DIAGNOSIS — E785 Hyperlipidemia, unspecified: Secondary | ICD-10-CM | POA: Diagnosis not present

## 2015-08-25 DIAGNOSIS — R7989 Other specified abnormal findings of blood chemistry: Secondary | ICD-10-CM

## 2015-08-25 DIAGNOSIS — N184 Chronic kidney disease, stage 4 (severe): Secondary | ICD-10-CM | POA: Insufficient documentation

## 2015-08-25 DIAGNOSIS — Z0000 Encounter for general adult medical examination without abnormal findings: Secondary | ICD-10-CM

## 2015-08-25 LAB — TSH: TSH: 4.57 u[IU]/mL — AB (ref 0.35–4.50)

## 2015-08-25 LAB — CBC WITH DIFFERENTIAL/PLATELET
Basophils Absolute: 0.1 10*3/uL (ref 0.0–0.1)
Basophils Relative: 0.6 % (ref 0.0–3.0)
Eosinophils Absolute: 1.9 10*3/uL — ABNORMAL HIGH (ref 0.0–0.7)
HEMATOCRIT: 44.5 % (ref 39.0–52.0)
HEMOGLOBIN: 15.2 g/dL (ref 13.0–17.0)
LYMPHS PCT: 16.9 % (ref 12.0–46.0)
Lymphs Abs: 2.1 10*3/uL (ref 0.7–4.0)
MCHC: 34.2 g/dL (ref 30.0–36.0)
MCV: 89.2 fl (ref 78.0–100.0)
MONO ABS: 0.5 10*3/uL (ref 0.1–1.0)
MONOS PCT: 4.3 % (ref 3.0–12.0)
Neutro Abs: 7.7 10*3/uL (ref 1.4–7.7)
Neutrophils Relative %: 62.8 % (ref 43.0–77.0)
Platelets: 263 10*3/uL (ref 150.0–400.0)
RBC: 4.98 Mil/uL (ref 4.22–5.81)
RDW: 14.3 % (ref 11.5–15.5)
WBC: 12.3 10*3/uL — AB (ref 4.0–10.5)

## 2015-08-25 LAB — BASIC METABOLIC PANEL
BUN: 33 mg/dL — AB (ref 6–23)
CALCIUM: 9.6 mg/dL (ref 8.4–10.5)
CO2: 24 meq/L (ref 19–32)
CREATININE: 2.29 mg/dL — AB (ref 0.40–1.50)
Chloride: 103 mEq/L (ref 96–112)
GFR: 29.81 mL/min — AB (ref >60.00)
GLUCOSE: 118 mg/dL — AB (ref 70–99)
Potassium: 4.3 mEq/L (ref 3.5–5.1)
Sodium: 138 mEq/L (ref 135–145)

## 2015-08-25 LAB — LIPID PANEL
Cholesterol: 153 mg/dL (ref 0–200)
HDL: 25.1 mg/dL — AB (ref >39.00)
NONHDL: 127.86
Total CHOL/HDL Ratio: 6
Triglycerides: 236 mg/dL — ABNORMAL HIGH (ref 0.0–149.0)
VLDL: 47.2 mg/dL — ABNORMAL HIGH (ref 0.0–40.0)

## 2015-08-25 LAB — LDL CHOLESTEROL, DIRECT: Direct LDL: 85 mg/dL

## 2015-08-25 LAB — HEPATIC FUNCTION PANEL
ALT: 14 U/L (ref 0–53)
AST: 13 U/L (ref 0–37)
Albumin: 4.2 g/dL (ref 3.5–5.2)
Alkaline Phosphatase: 106 U/L (ref 39–117)
Bilirubin, Direct: 0.2 mg/dL (ref 0.0–0.3)
TOTAL PROTEIN: 6.8 g/dL (ref 6.0–8.3)
Total Bilirubin: 0.8 mg/dL (ref 0.2–1.2)

## 2015-08-25 NOTE — Progress Notes (Signed)
Subjective:    Patient ID: Glen [name], male    DOB: 05-22-1940, 75 y.o.   MRN: [id number]  HPI Patient several complete physical. He seems to have poor insight into his health. He had visit with cardiologist last year. EKG changes were noted. He went for echocardiogram with no wall motion abnormalities. Recommendation was for [facility] but patient refused. He's had no chest pains.  Last colonoscopy 2012. Recommended five-year follow-up secondary previous history of adenomatous polyps. Patient refuses.  History of chronic kidney disease. See nephrologist previously. He refuses follow-up.  He has hypertension and hyperlipidemia and medications reviewed. He states is compliant with all. Immunizations are up-to-date. Does not get much exercise.  Past Medical History  Diagnosis Date  . CAD (coronary artery disease) 2004    s/p MI, s/p stents LCX and prox LAD   . Hypertension   . Chronic kidney disease     renal failure  . Hyperlipidemia   . Hx of colonoscopy 05/15/2005  . Obesity   . Bradycardia     requiring discontinuation of b-blocker  . Renal insufficiency   . Myocardial infarction [facility])     08/02/2002   Past Surgical History  Procedure Laterality Date  . Cardiac catheterization    . Cardiac catheterization  2004    2 stents placed  . Colonoscopy    . Polypectomy      reports that he has never smoked. He has never used smokeless tobacco. He reports that he drinks about 0.6 oz of alcohol per week. His drug history is not on file. family history includes Alcohol abuse in his brother; Aneurysm in his father; Heart attack in his brother; Hypertension in his father. Allergies  Allergen Reactions  . Atenolol     REACTION: bradycardia      Review of Systems  Constitutional: Negative for fever, activity change, appetite change, fatigue and unexpected weight change.  HENT: Negative for congestion, ear pain and trouble swallowing.   Eyes: Negative for pain  and visual disturbance.  Respiratory: Negative for cough, shortness of breath and wheezing.   Cardiovascular: Negative for chest pain and palpitations.  Gastrointestinal: Negative for nausea, vomiting, abdominal pain, diarrhea, constipation, blood in stool, abdominal distention and rectal pain.  Genitourinary: Negative for dysuria, hematuria and testicular pain.  Musculoskeletal: Positive for arthralgias. Negative for joint swelling.  Skin: Negative for rash.  Neurological: Negative for dizziness, syncope and headaches.  Hematological: Negative for adenopathy.  Psychiatric/Behavioral: Negative for confusion and dysphoric mood.       Objective:   Physical Exam  Constitutional: He is oriented to person, place, and time. He appears well-developed and well-nourished. No distress.  HENT:  Head: Normocephalic and atraumatic.  Right Ear: External ear normal.  Left Ear: External ear normal.  Mouth/Throat: Oropharynx is clear and moist.  Eyes: Conjunctivae and EOM are normal. Pupils are equal, round, and reactive to light.  Neck: Normal range of motion. Neck supple. No thyromegaly present.  Cardiovascular: Normal rate, regular rhythm and normal heart sounds.   Pulmonary/Chest: No respiratory distress. He has no wheezes. He has no rales.  Abdominal: Soft. Bowel sounds are normal. He exhibits no distension and no mass. There is no tenderness. There is no rebound and no guarding.  Musculoskeletal: He exhibits no edema.  Lymphadenopathy:    He has no cervical adenopathy.  Neurological: He is alert and oriented to person, place, and time. He displays normal reflexes. No cranial nerve deficit.  Skin:  He has extensive  seborrheic keratoses.  Psychiatric: He has a normal mood and affect.          Assessment & Plan:  Physical exam. Obtain screening labs. Immunizations up-to-date. Recommend continue yearly flu vaccine. Patient refuses cardiac stress testing as above and also refuses referral for  repeat colonoscopy even though this was recommended for five-year interval by GI. He has chronic kidney disease with GFR around 29 and declines further nephrology follow-up at this time  Eulas Post MD [facility] at [facility]

## 2015-08-25 NOTE — Progress Notes (Signed)
Pre visit review using our clinic review tool, if applicable. No additional management support is needed unless otherwise documented below in the visit note. 

## 2015-09-26 ENCOUNTER — Encounter: Payer: Self-pay | Admitting: Gastroenterology

## 2015-11-08 ENCOUNTER — Other Ambulatory Visit: Payer: Self-pay | Admitting: Family Medicine

## 2015-12-14 ENCOUNTER — Other Ambulatory Visit: Payer: Self-pay | Admitting: Cardiology

## 2016-01-15 ENCOUNTER — Encounter: Payer: Self-pay | Admitting: Cardiology

## 2016-01-23 ENCOUNTER — Ambulatory Visit (INDEPENDENT_AMBULATORY_CARE_PROVIDER_SITE_OTHER): Payer: Commercial Managed Care - HMO | Admitting: Family Medicine

## 2016-01-23 DIAGNOSIS — Z23 Encounter for immunization: Secondary | ICD-10-CM | POA: Diagnosis not present

## 2016-01-24 ENCOUNTER — Ambulatory Visit: Payer: Commercial Managed Care - HMO

## 2016-01-28 NOTE — Progress Notes (Signed)
HPI The patient presents for follow up of CAD. He had a PCI years ago and since has had no new cardiovascular symptoms. We did try to do an exercise treadmill test 4 years ago and it was submaximal but there was no evidence of ischemia.  The patient had markedly abnormal EKG changes last year when I saw him.  An echo suggested that his EF was slightly lower than previous.  He was to have a perfusion study.   However, he did not want to have this done as he thought it was a walking test..  He returns for follow up.   Of note his EKG actually looks better today with T-wave inversion resolved. He reports he is been doing well. He might get some shortness of breath with activities but he's doing some significant activities pushing a mower doing some repair work on a [employer].  The patient denies any new symptoms such as chest discomfort, neck or arm discomfort. There has been no new PND or orthopnea. There have been no reported palpitations, presyncope or syncope.   Allergies  Allergen Reactions  . Atenolol     REACTION: bradycardia    Current Outpatient Prescriptions  Medication Sig Dispense Refill  . amLODipine (NORVASC) 10 MG tablet TAKE 1 TABLET EVERY DAY 90 tablet 3  . aspirin 81 MG tablet Take 1 tab daily (Patient taking differently: Take 81 mg by mouth daily. Take 1 tab daily)    . losartan-hydrochlorothiazide (HYZAAR) 100-12.5 MG tablet TAKE 1 TABLET EVERY DAY 90 tablet 3  . multivitamin-lutein (OCUVITE-LUTEIN) CAPS capsule Take 1 capsule by mouth daily.    . pravastatin (PRAVACHOL) 80 MG tablet Take 1 tablet (80 mg total) by mouth every evening. Please keep 01/29/16 appt for further refills. 60 tablet 0   No current facility-administered medications for this visit.     Past Medical History:  Diagnosis Date  . Bradycardia    requiring discontinuation of b-blocker  . CAD (coronary artery disease) 2004   s/p MI, s/p stents LCX and prox LAD   . Chronic kidney disease    renal failure    . Hx of colonoscopy 05/15/2005  . Hyperlipidemia   . Hypertension   . Myocardial infarction    08/02/2002  . Obesity   . Renal insufficiency     Past Surgical History:  Procedure Laterality Date  . CARDIAC CATHETERIZATION    . CARDIAC CATHETERIZATION  2004   2 stents placed  . COLONOSCOPY    . POLYPECTOMY     ROS:  As stated in the HPI and negative for all other systems.  PHYSICAL EXAM BP 134/76 (BP Location: Left Arm, Patient Position: Sitting, Cuff Size: Normal)   Pulse 62   Ht 6' (1.829 m)   Wt 235 lb 6.4 oz (106.8 kg)   BMI 31.93 kg/m  GENERAL:  Well appearing NECK:  No jugular venous distention, waveform within normal limits, carotid upstroke brisk and symmetric, no bruits, no thyromegaly LUNGS:  Clear to auscultation bilaterally CHEST:  Unremarkable HEART:  PMI not displaced or sustained,S1 and S2 within normal limits, no S3, no S4, no clicks, no rubs, no murmurs ABD:  Flat, positive bowel sounds normal in frequency in pitch, no bruits, no rebound, no guarding, no midline pulsatile mass, no hepatomegaly, no splenomegaly EXT:  2 plus pulses throughout, no edema, no cyanosis no clubbing NEURO:  Nonfocal  EKG:  Sinus rhythm, rate 62 inferior infarct, lateral infarct.  He has nonspecific inferior T wave flattening.  Previous inferior and lateral T-wave inversions are now resolved.  01/30/2016   ASSESSMENT AND PLAN  CAD:  The patient has no new sypmtoms.  He did have a markedly abnormal EKG. He was unable to walk on a treadmill in the past. With his dynamic T-wave changes he's not dyspneic screening. He will have a [employer].  HTN:  The blood pressure is at target. No change in medications is indicated. We will continue with therapeutic lifestyle changes (TLC).  HYPERLIPIDEMIA:  His LDL was 85this spring.  HDL was 30.  No change in therapy is indicated.    CKD:  This has been stable.  He denies having this because he urinates normally. He has a reduced  understanding of issues we discussed including his renal insufficiency and his abnormal EKG. I tried to explain these things as best I can.

## 2016-01-29 ENCOUNTER — Ambulatory Visit (INDEPENDENT_AMBULATORY_CARE_PROVIDER_SITE_OTHER): Payer: Commercial Managed Care - HMO | Admitting: Cardiology

## 2016-01-29 ENCOUNTER — Encounter: Payer: Self-pay | Admitting: Cardiology

## 2016-01-29 VITALS — BP 134/76 | HR 62 | Ht 72.0 in | Wt 235.4 lb

## 2016-01-29 DIAGNOSIS — I2510 Atherosclerotic heart disease of native coronary artery without angina pectoris: Secondary | ICD-10-CM

## 2016-01-29 DIAGNOSIS — R0602 Shortness of breath: Secondary | ICD-10-CM

## 2016-01-29 DIAGNOSIS — R9431 Abnormal electrocardiogram [ECG] [EKG]: Secondary | ICD-10-CM

## 2016-01-29 NOTE — Patient Instructions (Signed)
Medication Instructions:  Continue current medications  Labwork: None Ordered  Testing/Procedures: Your physician has requested that you have a lexiscan myoview. For further information please visit [email or web address]. Please follow instruction sheet, as given.  Follow-Up: Your physician wants you to follow-up in: 1 Year. You will receive a reminder letter in the mail two months in advance. If you don't receive a letter, please call our office to schedule the follow-up appointment.   Any Other Special Instructions Will Be Listed Below (If Applicable).   If you need a refill on your cardiac medications before your next appointment, please call your pharmacy.   

## 2016-01-30 ENCOUNTER — Encounter: Payer: Self-pay | Admitting: Cardiology

## 2016-01-31 ENCOUNTER — Other Ambulatory Visit: Payer: Self-pay | Admitting: Cardiology

## 2016-01-31 NOTE — Telephone Encounter (Signed)
Rx(s) sent to pharmacy electronically.  

## 2016-02-01 ENCOUNTER — Telehealth (HOSPITAL_COMMUNITY): Payer: Self-pay

## 2016-02-01 NOTE — Telephone Encounter (Signed)
Encounter complete. 

## 2016-02-06 ENCOUNTER — Ambulatory Visit (HOSPITAL_COMMUNITY)
Admission: RE | Admit: 2016-02-06 | Discharge: 2016-02-06 | Disposition: A | Discharge status: Home / Self Care | Payer: Commercial Managed Care - HMO | Source: Ambulatory Visit | Attending: Cardiovascular Disease | Admitting: Cardiovascular Disease

## 2016-02-06 DIAGNOSIS — E669 Obesity, unspecified: Secondary | ICD-10-CM | POA: Insufficient documentation

## 2016-02-06 DIAGNOSIS — I2510 Atherosclerotic heart disease of native coronary artery without angina pectoris: Secondary | ICD-10-CM | POA: Insufficient documentation

## 2016-02-06 DIAGNOSIS — R0602 Shortness of breath: Secondary | ICD-10-CM | POA: Insufficient documentation

## 2016-02-06 DIAGNOSIS — N184 Chronic kidney disease, stage 4 (severe): Secondary | ICD-10-CM | POA: Diagnosis not present

## 2016-02-06 DIAGNOSIS — Z9861 Coronary angioplasty status: Secondary | ICD-10-CM | POA: Diagnosis not present

## 2016-02-06 DIAGNOSIS — Z6831 Body mass index (BMI) 31.0-31.9, adult: Secondary | ICD-10-CM | POA: Insufficient documentation

## 2016-02-06 DIAGNOSIS — R9431 Abnormal electrocardiogram [ECG] [EKG]: Secondary | ICD-10-CM | POA: Diagnosis not present

## 2016-02-06 DIAGNOSIS — I129 Hypertensive chronic kidney disease with stage 1 through stage 4 chronic kidney disease, or unspecified chronic kidney disease: Secondary | ICD-10-CM | POA: Diagnosis not present

## 2016-02-06 DIAGNOSIS — R9439 Abnormal result of other cardiovascular function study: Secondary | ICD-10-CM | POA: Diagnosis not present

## 2016-02-06 DIAGNOSIS — I252 Old myocardial infarction: Secondary | ICD-10-CM | POA: Insufficient documentation

## 2016-02-06 DIAGNOSIS — Z8249 Family history of ischemic heart disease and other diseases of the circulatory system: Secondary | ICD-10-CM | POA: Diagnosis not present

## 2016-02-06 LAB — MYOCARDIAL PERFUSION IMAGING
CHL CUP NUCLEAR SRS: 13
LV dias vol: 139 mL (ref 62–150)
LV sys vol: 73 mL
NUC STRESS TID: 1.08
Peak HR: 74 {beats}/min
Rest HR: 58 {beats}/min
SDS: 3
SSS: 16

## 2016-02-06 MED ORDER — TECHNETIUM TC 99M TETROFOSMIN IV KIT
32.0000 | PACK | Freq: Once | INTRAVENOUS | Status: AC | PRN
  Administered 2016-02-06: 32 via INTRAVENOUS
  Filled 2016-02-06: qty 32

## 2016-02-06 MED ORDER — TECHNETIUM TC 99M TETROFOSMIN IV KIT
10.7000 | PACK | Freq: Once | INTRAVENOUS | Status: AC | PRN
  Administered 2016-02-06: 10.7 via INTRAVENOUS
  Filled 2016-02-06: qty 11

## 2016-02-06 MED ORDER — REGADENOSON 0.4 MG/5ML IV SOLN
0.4000 mg | Freq: Once | INTRAVENOUS | Status: AC
  Administered 2016-02-06: 0.4 mg via INTRAVENOUS

## 2016-02-20 ENCOUNTER — Encounter: Payer: Self-pay | Admitting: Family Medicine

## 2016-02-20 ENCOUNTER — Ambulatory Visit (INDEPENDENT_AMBULATORY_CARE_PROVIDER_SITE_OTHER): Payer: Commercial Managed Care - HMO | Admitting: Family Medicine

## 2016-02-20 VITALS — BP 140/86 | HR 56 | Temp 98.0°F | Wt 235.2 lb

## 2016-02-20 DIAGNOSIS — R946 Abnormal results of thyroid function studies: Secondary | ICD-10-CM

## 2016-02-20 DIAGNOSIS — I10 Essential (primary) hypertension: Secondary | ICD-10-CM

## 2016-02-20 DIAGNOSIS — R7989 Other specified abnormal findings of blood chemistry: Secondary | ICD-10-CM

## 2016-02-20 DIAGNOSIS — E785 Hyperlipidemia, unspecified: Secondary | ICD-10-CM

## 2016-02-20 DIAGNOSIS — N184 Chronic kidney disease, stage 4 (severe): Secondary | ICD-10-CM

## 2016-02-20 DIAGNOSIS — L989 Disorder of the skin and subcutaneous tissue, unspecified: Secondary | ICD-10-CM

## 2016-02-20 LAB — TSH: TSH: 6.19 u[IU]/mL — AB (ref 0.35–4.50)

## 2016-02-20 LAB — BASIC METABOLIC PANEL
BUN: 43 mg/dL — ABNORMAL HIGH (ref 6–23)
CHLORIDE: 103 meq/L (ref 96–112)
CO2: 26 mEq/L (ref 19–32)
Calcium: 9.4 mg/dL (ref 8.4–10.5)
Creatinine, Ser: 2.33 mg/dL — ABNORMAL HIGH (ref 0.40–1.50)
GFR: 29.18 mL/min — AB (ref >60.00)
Glucose, Bld: 104 mg/dL — ABNORMAL HIGH (ref 70–99)
POTASSIUM: 4.3 meq/L (ref 3.5–5.1)
SODIUM: 139 meq/L (ref 135–145)

## 2016-02-20 NOTE — Progress Notes (Signed)
Pre visit review using our clinic review tool, if applicable. No additional management support is needed unless otherwise documented below in the visit note. 

## 2016-02-20 NOTE — Progress Notes (Signed)
Subjective:     Patient ID: Glen [name], male   DOB: [birth year], 75 y.o.   MRN: [id number]  HPI Patient here for medical follow-up. He has chronic problems of chronic disease, hyperlipidemia, hypertension, CAD. Recent Lexiscan nuclear study no ischemia. Medications reviewed with no changes. Compliant with all. No recent chest pains. No falls.  Last visit 6 months ago here elevated TSH which was minimally elevated. No family history of hypothyroidism. Denies any constipation. No cold intolerance. Gen. mild fatigue.  Patient has irritated area right upper back. He has long history of multiple seborrheic keratoses. He has some itching and occasional soreness right upper back region. No bleeding.  Past Medical History:  Diagnosis Date  . Bradycardia    requiring discontinuation of b-blocker  . CAD (coronary artery disease) 2004   s/p MI, s/p stents LCX and prox LAD   . Chronic kidney disease    renal failure  . Hx of colonoscopy 05/15/2005  . Hyperlipidemia   . Hypertension   . Myocardial infarction    08/02/2002  . Obesity   . Renal insufficiency    Past Surgical History:  Procedure Laterality Date  . CARDIAC CATHETERIZATION    . CARDIAC CATHETERIZATION  2004   2 stents placed  . COLONOSCOPY    . POLYPECTOMY      reports that he has never smoked. He has never used smokeless tobacco. He reports that he drinks about 0.6 oz of alcohol per week . He reports that he does not use drugs. family history includes Alcohol abuse in his brother; Aneurysm in his father; Heart attack in his brother; Hypertension in his father. Allergies  Allergen Reactions  . Atenolol     REACTION: bradycardia     Review of Systems  Constitutional: Positive for fatigue. Negative for appetite change and unexpected weight change.  Eyes: Negative for visual disturbance.  Respiratory: Negative for cough, chest tightness and shortness of breath.   Cardiovascular: Negative for chest pain, palpitations and  leg swelling.  Endocrine: Negative for cold intolerance.  Neurological: Negative for dizziness, syncope, weakness, light-headedness and headaches.       Objective:   Physical Exam  Constitutional: He is oriented to person, place, and time. He appears well-developed and well-nourished.  HENT:  Right Ear: External ear normal.  Left Ear: External ear normal.  Mouth/Throat: Oropharynx is clear and moist.  Eyes: Pupils are equal, round, and reactive to light.  Neck: Neck supple. No thyromegaly present.  Cardiovascular: Normal rate and regular rhythm.   Pulmonary/Chest: Effort normal and breath sounds normal. No respiratory distress. He has no wheezes. He has no rales.  Musculoskeletal: He exhibits no edema.  Neurological: He is alert and oriented to person, place, and time.  Skin:  Patient has multiple seborrheic keratoses. Right upper back reveals a fairly large area which is approximately 3-4 cm of erythematous base with thick irritated appearing seborrheic keratosis on the surface. No ulceration       Assessment:     #1 hypertension stable and at goal  #2 history of CAD with recent unremarkable nuclear stress test  #3 chronic kidney disease stage IV  #4 recent elevated TSH  #5 large skin lesion right upper back. Question irritated seborrheic keratoses. Rule out Bowen's disease versus other    Plan:     -Patient rescheduled for skin biopsy right upper back -Repeat TSH and basic metabolic panel -Continue current medications -Flu vaccine already given  Eulas Post MD [facility]  at [facility]

## 2016-02-22 ENCOUNTER — Other Ambulatory Visit: Payer: Self-pay

## 2016-02-22 DIAGNOSIS — E039 Hypothyroidism, unspecified: Secondary | ICD-10-CM

## 2016-02-22 MED ORDER — LEVOTHYROXINE SODIUM 25 MCG PO CAPS: 25.0000 ug | ORAL_CAPSULE | Freq: Every day | ORAL | 1 refills | Status: AC

## 2016-02-28 ENCOUNTER — Ambulatory Visit (INDEPENDENT_AMBULATORY_CARE_PROVIDER_SITE_OTHER): Payer: Commercial Managed Care - HMO | Admitting: Family Medicine

## 2016-02-28 ENCOUNTER — Encounter: Payer: Self-pay | Admitting: Family Medicine

## 2016-02-28 VITALS — BP 130/70 | HR 73 | Temp 97.7°F | Ht 72.0 in | Wt 236.0 lb

## 2016-02-28 DIAGNOSIS — L989 Disorder of the skin and subcutaneous tissue, unspecified: Secondary | ICD-10-CM | POA: Diagnosis not present

## 2016-02-28 DIAGNOSIS — D045 Carcinoma in situ of skin of trunk: Secondary | ICD-10-CM | POA: Diagnosis not present

## 2016-02-28 NOTE — Progress Notes (Signed)
Subjective:     Patient ID: Glen [name], male   DOB: 1940/09/07, 75 y.o.   MRN: [id number]  HPI Patient is here for biopsy of skin lesion. This is a procedure only visit for skin lesion that was noted on recent exam. He had recent exam and we noted large area of erythema right upper back measuring 6 x 3.5 cm. He has multiple seborrheic keratoses and has some thickened hyperkeratotic tissue new the center. No ulceration. Base is very erythematous with distinct margin.  Denies prior history of skin cancer. No bleeding. Occasional itching. Has not tried anything topically.  Past Medical History:  Diagnosis Date  . Bradycardia    requiring discontinuation of b-blocker  . CAD (coronary artery disease) 2004   s/p MI, s/p stents LCX and prox LAD   . Chronic kidney disease    renal failure  . Hx of colonoscopy 05/15/2005  . Hyperlipidemia   . Hypertension   . Myocardial infarction    08/02/2002  . Obesity   . Renal insufficiency    Past Surgical History:  Procedure Laterality Date  . CARDIAC CATHETERIZATION    . CARDIAC CATHETERIZATION  2004   2 stents placed  . COLONOSCOPY    . POLYPECTOMY      reports that he has never smoked. He has never used smokeless tobacco. He reports that he drinks about 0.6 oz of alcohol per week . He reports that he does not use drugs. family history includes Alcohol abuse in his brother; Aneurysm in his father; Heart attack in his brother; Hypertension in his father. Allergies  Allergen Reactions  . Atenolol     REACTION: bradycardia    Review of Systems  Constitutional: Negative for appetite change and unexpected weight change.       Objective:   Physical Exam  Constitutional: He appears well-developed and well-nourished.  Cardiovascular: Normal rate and regular rhythm.   Pulmonary/Chest: Effort normal and breath sounds normal. No respiratory distress. He has no wheezes. He has no rales.  Skin:  Patient has 6 x 3.5 cm area of erythema with  distinct margin right upper back with hyperkeratotic areas near the center       Assessment:     Skin lesion right upper back. He is not have any nodular qualities to suggest basal cell. Question irritated seborrheic keratoses. Rule out squamous cell carcinoma in situ    Plan:     -Recommended skin punch biopsy of lesion above. We discussed risk including bleeding, bruising, infection, scarring and patient consented -Anesthesia with 1% Xylocaine with epinephrine. Prepped skin with Betadine. Using sterile technique performed 5 mm punch biopsy. Minimal bleeding. Wound closed with 2 sutures of 4-0 Ethilon. Topical antibiotic and Band-Aid applied -Return in one week for suture removal -Wound care instruction given  Eulas Post MD [facility] at [facility]

## 2016-02-28 NOTE — Patient Instructions (Signed)
Keep wound dry for the first 24 hours then clean daily with soap and water for one week. Apply topical antibiotic daily for 3-4 days. Keep covered with clean dressing for 4-5 days. Follow up promptly for any signs of infection such as redness, warmth, pain, or drainage. Return in one week for suture removal.

## 2016-02-28 NOTE — Progress Notes (Signed)
Pre visit review using our clinic review tool, if applicable. No additional management support is needed unless otherwise documented below in the visit note. 

## 2016-03-06 ENCOUNTER — Encounter: Payer: Self-pay | Admitting: Family Medicine

## 2016-03-06 ENCOUNTER — Ambulatory Visit (INDEPENDENT_AMBULATORY_CARE_PROVIDER_SITE_OTHER): Payer: Commercial Managed Care - HMO | Admitting: Family Medicine

## 2016-03-06 VITALS — BP 140/84 | HR 72 | Temp 97.9°F | Ht 72.0 in | Wt 236.6 lb

## 2016-03-06 DIAGNOSIS — C4492 Squamous cell carcinoma of skin, unspecified: Secondary | ICD-10-CM

## 2016-03-06 DIAGNOSIS — I10 Essential (primary) hypertension: Secondary | ICD-10-CM | POA: Diagnosis not present

## 2016-03-06 NOTE — Progress Notes (Signed)
Pre visit review using our clinic review tool, if applicable. No additional management support is needed unless otherwise documented below in the visit note. 

## 2016-03-06 NOTE — Progress Notes (Signed)
Subjective:     Patient ID: Glen [name], male   DOB: Mar 10, 1941, 75 y.o.   MRN: [id number]  HPI Patient here to follow-up regarding recent biopsy. He has multiple seborrheic keratosis and we noted a fairly large area of erythema right upper back over his upper scapular region. He was not aware of any itching or bleeding or pain. 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 He denies any prior history of skin cancer. He's had no difficulties with healing  Hypertension has been stable.  Compliant with medications with no side effects.  Past Medical History:  Diagnosis Date  . Bradycardia    requiring discontinuation of b-blocker  . CAD (coronary artery disease) 2004   s/p MI, s/p stents LCX and prox LAD   . Chronic kidney disease    renal failure  . Hx of colonoscopy 05/15/2005  . Hyperlipidemia   . Hypertension   . Myocardial infarction    08/02/2002  . Obesity   . Renal insufficiency    Past Surgical History:  Procedure Laterality Date  . CARDIAC CATHETERIZATION    . CARDIAC CATHETERIZATION  2004   2 stents placed  . COLONOSCOPY    . POLYPECTOMY      reports that he has never smoked. He has never used smokeless tobacco. He reports that he drinks about 0.6 oz of alcohol per week . He reports that he does not use drugs. family history includes Alcohol abuse in his brother; Aneurysm in his father; Heart attack in his brother; Hypertension in his father. Allergies  Allergen Reactions  . Atenolol     REACTION: bradycardia     Review of Systems  Constitutional: Negative for appetite change, fatigue and unexpected weight change.  Eyes: Negative for visual disturbance.  Respiratory: Negative for cough, chest tightness and shortness of breath.   Cardiovascular: Negative for chest pain, palpitations and leg swelling.  Neurological: Negative for dizziness, syncope, weakness,  light-headedness and headaches.       Objective:   Physical Exam  Constitutional: He appears well-developed and well-nourished.  Cardiovascular: Normal rate and regular rhythm.   Skin:  Patient has area of erythema right upper back 3.5 x 5.5 cm with fairly well demarcated border. He has hyperkeratosis and site of biopsy along the inferior border is healing well. 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  Hypertension -stable and at goal.      Plan:     -Due to large area as above recommend referral to [facility] for further evaluation. -Sutures removed from biopsy site and this is healing very well with no signs of secondary infection. -continue with current medication regimen and routine follow up in 6 months.  Eulas Post MD [facility] at [facility]

## 2016-04-11 ENCOUNTER — Inpatient Hospital Stay (HOSPITAL_COMMUNITY)
Admission: EM | Admit: 2016-04-11 | Discharge: 2016-04-20 | DRG: 683 | Disposition: A | Discharge status: Skilled Nursing Facility | Payer: Medicare HMO | Attending: Internal Medicine | Admitting: Internal Medicine

## 2016-04-11 ENCOUNTER — Emergency Department (HOSPITAL_COMMUNITY): Payer: Medicare HMO

## 2016-04-11 ENCOUNTER — Encounter (HOSPITAL_COMMUNITY): Payer: Self-pay | Admitting: Emergency Medicine

## 2016-04-11 DIAGNOSIS — I493 Ventricular premature depolarization: Secondary | ICD-10-CM | POA: Diagnosis not present

## 2016-04-11 DIAGNOSIS — N184 Chronic kidney disease, stage 4 (severe): Secondary | ICD-10-CM | POA: Diagnosis present

## 2016-04-11 DIAGNOSIS — N289 Disorder of kidney and ureter, unspecified: Secondary | ICD-10-CM | POA: Diagnosis not present

## 2016-04-11 DIAGNOSIS — C4492 Squamous cell carcinoma of skin, unspecified: Secondary | ICD-10-CM

## 2016-04-11 DIAGNOSIS — M5126 Other intervertebral disc displacement, lumbar region: Secondary | ICD-10-CM | POA: Diagnosis not present

## 2016-04-11 DIAGNOSIS — M25562 Pain in left knee: Secondary | ICD-10-CM | POA: Diagnosis not present

## 2016-04-11 DIAGNOSIS — N1330 Unspecified hydronephrosis: Secondary | ICD-10-CM | POA: Diagnosis not present

## 2016-04-11 DIAGNOSIS — Z888 Allergy status to other drugs, medicaments and biological substances status: Secondary | ICD-10-CM

## 2016-04-11 DIAGNOSIS — R531 Weakness: Secondary | ICD-10-CM

## 2016-04-11 DIAGNOSIS — I252 Old myocardial infarction: Secondary | ICD-10-CM

## 2016-04-11 DIAGNOSIS — T8189XA Other complications of procedures, not elsewhere classified, initial encounter: Secondary | ICD-10-CM | POA: Diagnosis present

## 2016-04-11 DIAGNOSIS — Z79899 Other long term (current) drug therapy: Secondary | ICD-10-CM

## 2016-04-11 DIAGNOSIS — Z8249 Family history of ischemic heart disease and other diseases of the circulatory system: Secondary | ICD-10-CM

## 2016-04-11 DIAGNOSIS — Y9289 Other specified places as the place of occurrence of the external cause: Secondary | ICD-10-CM | POA: Diagnosis not present

## 2016-04-11 DIAGNOSIS — Y848 Other medical procedures as the cause of abnormal reaction of the patient, or of later complication, without mention of misadventure at the time of the procedure: Secondary | ICD-10-CM | POA: Diagnosis present

## 2016-04-11 DIAGNOSIS — I2510 Atherosclerotic heart disease of native coronary artery without angina pectoris: Secondary | ICD-10-CM | POA: Diagnosis present

## 2016-04-11 DIAGNOSIS — M5137 Other intervertebral disc degeneration, lumbosacral region: Secondary | ICD-10-CM | POA: Diagnosis not present

## 2016-04-11 DIAGNOSIS — E669 Obesity, unspecified: Secondary | ICD-10-CM | POA: Diagnosis present

## 2016-04-11 DIAGNOSIS — Z955 Presence of coronary angioplasty implant and graft: Secondary | ICD-10-CM | POA: Diagnosis not present

## 2016-04-11 DIAGNOSIS — I471 Supraventricular tachycardia: Secondary | ICD-10-CM | POA: Diagnosis not present

## 2016-04-11 DIAGNOSIS — R740 Nonspecific elevation of levels of transaminase and lactic acid dehydrogenase [LDH]: Secondary | ICD-10-CM | POA: Diagnosis present

## 2016-04-11 DIAGNOSIS — R5383 Other fatigue: Secondary | ICD-10-CM | POA: Diagnosis not present

## 2016-04-11 DIAGNOSIS — D72829 Elevated white blood cell count, unspecified: Secondary | ICD-10-CM

## 2016-04-11 DIAGNOSIS — Z7982 Long term (current) use of aspirin: Secondary | ICD-10-CM

## 2016-04-11 DIAGNOSIS — I472 Ventricular tachycardia: Secondary | ICD-10-CM | POA: Diagnosis not present

## 2016-04-11 DIAGNOSIS — R29898 Other symptoms and signs involving the musculoskeletal system: Secondary | ICD-10-CM

## 2016-04-11 DIAGNOSIS — G629 Polyneuropathy, unspecified: Secondary | ICD-10-CM | POA: Diagnosis not present

## 2016-04-11 DIAGNOSIS — E872 Acidosis, unspecified: Secondary | ICD-10-CM

## 2016-04-11 DIAGNOSIS — R2681 Unsteadiness on feet: Secondary | ICD-10-CM

## 2016-04-11 DIAGNOSIS — R918 Other nonspecific abnormal finding of lung field: Secondary | ICD-10-CM | POA: Diagnosis present

## 2016-04-11 DIAGNOSIS — G610 Guillain-Barre syndrome: Secondary | ICD-10-CM

## 2016-04-11 DIAGNOSIS — I10 Essential (primary) hypertension: Secondary | ICD-10-CM | POA: Diagnosis present

## 2016-04-11 DIAGNOSIS — Z9181 History of falling: Secondary | ICD-10-CM

## 2016-04-11 DIAGNOSIS — I129 Hypertensive chronic kidney disease with stage 1 through stage 4 chronic kidney disease, or unspecified chronic kidney disease: Secondary | ICD-10-CM | POA: Diagnosis not present

## 2016-04-11 DIAGNOSIS — I25810 Atherosclerosis of coronary artery bypass graft(s) without angina pectoris: Secondary | ICD-10-CM | POA: Diagnosis not present

## 2016-04-11 DIAGNOSIS — G8220 Paraplegia, unspecified: Secondary | ICD-10-CM

## 2016-04-11 DIAGNOSIS — R404 Transient alteration of awareness: Secondary | ICD-10-CM | POA: Diagnosis not present

## 2016-04-11 DIAGNOSIS — E785 Hyperlipidemia, unspecified: Secondary | ICD-10-CM | POA: Diagnosis not present

## 2016-04-11 DIAGNOSIS — Z7401 Bed confinement status: Secondary | ICD-10-CM

## 2016-04-11 DIAGNOSIS — Z6831 Body mass index (BMI) 31.0-31.9, adult: Secondary | ICD-10-CM

## 2016-04-11 DIAGNOSIS — R9431 Abnormal electrocardiogram [ECG] [EKG]: Secondary | ICD-10-CM | POA: Diagnosis not present

## 2016-04-11 DIAGNOSIS — M25561 Pain in right knee: Secondary | ICD-10-CM | POA: Diagnosis not present

## 2016-04-11 DIAGNOSIS — E039 Hypothyroidism, unspecified: Secondary | ICD-10-CM | POA: Diagnosis present

## 2016-04-11 DIAGNOSIS — M25462 Effusion, left knee: Secondary | ICD-10-CM | POA: Diagnosis not present

## 2016-04-11 DIAGNOSIS — N179 Acute kidney failure, unspecified: Principal | ICD-10-CM | POA: Diagnosis present

## 2016-04-11 DIAGNOSIS — N183 Chronic kidney disease, stage 3 (moderate): Secondary | ICD-10-CM | POA: Diagnosis not present

## 2016-04-11 DIAGNOSIS — R339 Retention of urine, unspecified: Secondary | ICD-10-CM | POA: Diagnosis not present

## 2016-04-11 DIAGNOSIS — R262 Difficulty in walking, not elsewhere classified: Secondary | ICD-10-CM | POA: Diagnosis not present

## 2016-04-11 DIAGNOSIS — I255 Ischemic cardiomyopathy: Secondary | ICD-10-CM | POA: Diagnosis not present

## 2016-04-11 DIAGNOSIS — M6281 Muscle weakness (generalized): Secondary | ICD-10-CM | POA: Diagnosis not present

## 2016-04-11 DIAGNOSIS — M25461 Effusion, right knee: Secondary | ICD-10-CM | POA: Diagnosis not present

## 2016-04-11 HISTORY — DX: Atherosclerotic heart disease of native coronary artery without angina pectoris: I25.10

## 2016-04-11 HISTORY — DX: Coronary angioplasty status: Z98.61

## 2016-04-11 LAB — CBC WITH DIFFERENTIAL/PLATELET
BASOS ABS: 0 10*3/uL (ref 0.0–0.1)
Basophils Relative: 0 %
EOS PCT: 0 %
Eosinophils Absolute: 0 10*3/uL (ref 0.0–0.7)
HCT: 41.7 % (ref 39.0–52.0)
Hemoglobin: 15 g/dL (ref 13.0–17.0)
Lymphocytes Relative: 6 %
Lymphs Abs: 1.2 10*3/uL (ref 0.7–4.0)
MCH: 30.9 pg (ref 26.0–34.0)
MCHC: 36 g/dL (ref 30.0–36.0)
MCV: 85.8 fL (ref 78.0–100.0)
Monocytes Absolute: 1.1 10*3/uL — ABNORMAL HIGH (ref 0.1–1.0)
Monocytes Relative: 5 %
Neutro Abs: 18.4 10*3/uL — ABNORMAL HIGH (ref 1.7–7.7)
Neutrophils Relative %: 89 %
PLATELETS: 421 10*3/uL — AB (ref 150–400)
RBC: 4.86 MIL/uL (ref 4.22–5.81)
RDW: 13.7 % (ref 11.5–15.5)
WBC: 20.7 10*3/uL — AB (ref 4.0–10.5)

## 2016-04-11 LAB — URINALYSIS, ROUTINE W REFLEX MICROSCOPIC
BACTERIA UA: NONE SEEN
BILIRUBIN URINE: NEGATIVE
Glucose, UA: 50 mg/dL — AB
KETONES UR: NEGATIVE mg/dL
Leukocytes, UA: NEGATIVE
NITRITE: NEGATIVE
Protein, ur: >=300 mg/dL — AB
SPECIFIC GRAVITY, URINE: 1.017 (ref 1.005–1.030)
pH: 5 (ref 5.0–8.0)

## 2016-04-11 LAB — MAGNESIUM: Magnesium: 2.4 mg/dL (ref 1.7–2.4)

## 2016-04-11 LAB — COMPREHENSIVE METABOLIC PANEL
ALT: 87 U/L — AB (ref 17–63)
AST: 73 U/L — AB (ref 15–41)
Albumin: 3.1 g/dL — ABNORMAL LOW (ref 3.5–5.0)
Alkaline Phosphatase: 182 U/L — ABNORMAL HIGH (ref 38–126)
Anion gap: 14 (ref 5–15)
BUN: 80 mg/dL — ABNORMAL HIGH (ref 6–20)
CHLORIDE: 109 mmol/L (ref 101–111)
CO2: 21 mmol/L — AB (ref 22–32)
CREATININE: 3.82 mg/dL — AB (ref 0.61–1.24)
Calcium: 8.8 mg/dL — ABNORMAL LOW (ref 8.9–10.3)
GFR calc non Af Amer: 14 mL/min — ABNORMAL LOW (ref >60)
GFR, EST AFRICAN AMERICAN: 17 mL/min — AB (ref >60)
Glucose, Bld: 169 mg/dL — ABNORMAL HIGH (ref 65–99)
Potassium: 3.4 mmol/L — ABNORMAL LOW (ref 3.5–5.1)
SODIUM: 144 mmol/L (ref 135–145)
Total Bilirubin: 2.1 mg/dL — ABNORMAL HIGH (ref 0.3–1.2)
Total Protein: 7.6 g/dL (ref 6.5–8.1)

## 2016-04-11 LAB — I-STAT CG4 LACTIC ACID, ED
LACTIC ACID, VENOUS: 1.73 mmol/L (ref 0.5–1.9)
Lactic Acid, Venous: 2.04 mmol/L (ref 0.5–1.9)

## 2016-04-11 LAB — PHOSPHORUS: Phosphorus: 4 mg/dL (ref 2.5–4.6)

## 2016-04-11 LAB — I-STAT TROPONIN, ED: TROPONIN I, POC: 0.07 ng/mL (ref 0.00–0.08)

## 2016-04-11 LAB — CK: Total CK: 60 U/L (ref 49–397)

## 2016-04-11 LAB — T4, FREE: FREE T4: 1.32 ng/dL — AB (ref 0.61–1.12)

## 2016-04-11 LAB — TSH: TSH: 3.334 u[IU]/mL (ref 0.350–4.500)

## 2016-04-11 MED ORDER — PIPERACILLIN-TAZOBACTAM 3.375 G IVPB 30 MIN
3.3750 g | INTRAVENOUS | Status: AC
  Administered 2016-04-11: 3.375 g via INTRAVENOUS
  Filled 2016-04-11: qty 50

## 2016-04-11 MED ORDER — SODIUM CHLORIDE 0.9 % IV BOLUS (SEPSIS)
1000.0000 mL | Freq: Once | INTRAVENOUS | Status: AC
  Administered 2016-04-11: 1000 mL via INTRAVENOUS

## 2016-04-11 MED ORDER — SODIUM CHLORIDE 0.9 % IV SOLN
INTRAVENOUS | Status: DC
  Administered 2016-04-11: via INTRAVENOUS

## 2016-04-11 MED ORDER — ACETAMINOPHEN 325 MG PO TABS: 650.0000 mg | ORAL_TABLET | Freq: Four times a day (QID) | ORAL | Status: DC | PRN

## 2016-04-11 MED ORDER — ACETAMINOPHEN 650 MG RE SUPP: 650.0000 mg | Freq: Four times a day (QID) | RECTAL | Status: DC | PRN

## 2016-04-11 MED ORDER — POTASSIUM CHLORIDE CRYS ER 20 MEQ PO TBCR
40.0000 meq | EXTENDED_RELEASE_TABLET | ORAL | Status: AC
  Administered 2016-04-11: 40 meq via ORAL
  Filled 2016-04-11: qty 2

## 2016-04-11 MED ORDER — SODIUM CHLORIDE 0.9% FLUSH
3.0000 mL | Freq: Two times a day (BID) | INTRAVENOUS | Status: DC
  Administered 2016-04-11 – 2016-04-19 (×9): 3 mL via INTRAVENOUS

## 2016-04-11 MED ORDER — PIPERACILLIN-TAZOBACTAM 3.375 G IVPB: 3.3750 g | Freq: Three times a day (TID) | INTRAVENOUS | Status: DC

## 2016-04-11 NOTE — Consult Note (Signed)
NEURO HOSPITALIST CONSULT NOTE   Requestig physician: Dr. Sarajane Jews  Reason for Consult: Bilateral lower extremity weakness  History obtained from:  Patient and Chart     HPI:                                                                                                                                          Glen [name] is an 76 y.o. male who presented via EMS for evaluation of severe bilateral knee pain resulting in inability to walk. The patient complained of "weakness because he has not been out of bed." Also reports no bowel movement for 5 days.   The patient reported falling 2 weeks before Christmas; he is a poor historian, but does endorse injury to his knee caps and rib cage, with black and blue marks. He did not sustain head trauma due to the fall, which occurred when he slipped on ice. He stated to a member of the team initially assessing him that he has had severe generalized weakness for the last week due to the cold and because of this was unable to get out of bed. When asked by Neurologist, he states that 100% of his lower extremity weakness is due to limited movement because of his severe knee pain. He endorses swelling of his knees as well. Essentially, since January 1st, he has been bedridden. He noted that the fire department helped him get back into bed on Tuesday from a chair and that he has not moved since then. He has managed to void his bladder by urinating into containers brought by his wife.   His PMHx includes CAD s/p stenting x 2, MI, CKD, HTN and HLD.   Labs: Lactate elevated at 2.04. WBC 20.7. SCr 3.82 elevated, CrCl ~ 20 ml/min. Previous SCr ~ 2.3 (08/25/15 and 02/20/16). AST/ALT and Tbilirubin elevated.  Zosyn 3.375g IV over 30 min x1 dose started; then 3.375 g IV Q8H infused over 4 hrs. Two sets of blood cultures have been obtained.  Past Medical History:  Diagnosis Date  . Bradycardia    requiring discontinuation of b-blocker  .  CAD (coronary artery disease) 2004   s/p MI, s/p stents LCX and prox LAD   . Chronic kidney disease    renal failure  . Hx of colonoscopy 05/15/2005  . Hyperlipidemia   . Hypertension   . Myocardial infarction    08/02/2002  . Obesity   . Renal insufficiency     Past Surgical History:  Procedure Laterality Date  . CARDIAC CATHETERIZATION    . CARDIAC CATHETERIZATION  2004   2 stents placed  . COLONOSCOPY    . POLYPECTOMY      Family History  Problem Relation Age of Onset  . Aneurysm Father  brain  . Hypertension Father   . Heart attack Brother   . Alcohol abuse Brother    Social History:  reports that he has never smoked. He has never used smokeless tobacco. He reports that he drinks about 0.6 oz of alcohol per week . He reports that he does not use drugs.  Allergies  Allergen Reactions  . Atenolol     REACTION: bradycardia    MEDICATIONS:                                                                                                                     Scheduled: . sodium chloride flush  3 mL Intravenous Q12H    ROS:                                                                                                                                       History obtained from patient. No nausea, vomiting, diarrhea, abdominal pain, headaches, back pain or neck pain. Other ROS as per HPI.   Blood pressure (!) 153/101, pulse 83, temperature 97.8 F (36.6 C), temperature source Oral, resp. rate 22, height 5\' 10"  (1.778 m), weight 107 kg (236 lb), SpO2 97 %.  General Examination:                                                                                                      HEENT-  Normocephalic/atraumatic. Mucus membranes exhibit decreased hydration.  Lungs- No gross wheezing. Respirations unlabored.  Extremities- TTP at knees bilaterally with prominent swelling and crepitus noted. Eschar on dorsum of left foot.   Neurological Examination Mental Status: Alert,  oriented. Some tangentiality with speech. No receptive or expressive aphasia. Able to follow all commands without difficulty. Cranial Nerves: II: Visual fields grossly intact to bedside confrontation testin. PERRL.  III,IV, VI: ptosis not present, EOMI without nystagmus V,VII: smile symmetric, facial temperature sensation normal bilaterally VIII: hearing intact to conversation IX,X: No hypophonia.  XI: bilateral shoulder shrug is equal XII: midline tongue  extension Motor: RUE and LUE 5/5 except for limitations to left arm elevation at shoulder due to chronic injury.  Right and left lower extremity: symmetrical limitation to leg elevation, knee extension under a load, and knee flexion secondary to severe knee pain bilaterally. Foot dorsi/plantar flexion is 4+/5 on right and 4/5 on left.  Atrophy to intrinsic muscles of feet noted bilaterally Sensory: Temperature intact x 4 except for sensory deficit distal lower extremities in stocking distribution. FT intact x 4, without extinction. Severely impaired proprioception bilateral toes.  Deep Tendon Reflexes: Absent achilles reflexes, trace patellar reflexes, normoactive brachioradialis and biceps reflexes. Toes equivocal. Cerebellar: No ataxia with FNF. Action tremor is noted bilaterally.  Gait: Unable to test due to severe knee pain.     Lab Results: Basic Metabolic Panel:  Recent Labs Lab 04/11/16 1615  NA 144  K 3.4*  CL 109  CO2 21*  GLUCOSE 169*  BUN 80*  CREATININE 3.82*  CALCIUM 8.8*    Liver Function Tests:  Recent Labs Lab 04/11/16 1615  AST 73*  ALT 87*  ALKPHOS 182*  BILITOT 2.1*  PROT 7.6  ALBUMIN 3.1*   No results for input(s): LIPASE, AMYLASE in the last 168 hours. No results for input(s): AMMONIA in the last 168 hours.  CBC:  Recent Labs Lab 04/11/16 1615  WBC 20.7*  NEUTROABS 18.4*  HGB 15.0  HCT 41.7  MCV 85.8  PLT 421*    Cardiac Enzymes:  Recent Labs Lab 04/11/16 1621  CKTOTAL 60     Lipid Panel: No results for input(s): CHOL, TRIG, HDL, CHOLHDL, VLDL, LDLCALC in the last 168 hours.  CBG: No results for input(s): GLUCAP in the last 168 hours.  Microbiology: No results found for this or any previous visit.  Coagulation Studies: No results for input(s): LABPROT, INR in the last 72 hours.  Imaging: Dg Chest 2 View  Result Date: 04/11/2016 CLINICAL DATA:  Fatigue EXAM: CHEST  2 VIEW COMPARISON:  None. FINDINGS: Mild cardiomegaly. No confluent airspace opacities or effusions. No edema. No acute bony abnormality. Surgical changes in the proximal left humerus. IMPRESSION: Cardiomegaly.  No active disease. Electronically Signed   By: Rolm Baptise M.D.   On: 04/11/2016 17:31   Dg Knee Complete 4 Views Left  Result Date: 04/11/2016 CLINICAL DATA:  Bilateral knee pain after fall several weeks ago. EXAM: LEFT KNEE - COMPLETE 4+ VIEW COMPARISON:  None. FINDINGS: No definite fracture or dislocation is noted. Moderate suprapatellar joint effusion is noted. Spurring of patella is noted. Joint spaces appear to be intact. IMPRESSION: Moderate suprapatellar joint effusion. No definite evidence of fracture or dislocation. Electronically Signed   By: Marijo Conception, M.D.   On: 04/11/2016 13:51   Dg Knee Complete 4 Views Right  Result Date: 04/11/2016 CLINICAL DATA:  Bilateral knee pain after fall several weeks ago. EXAM: RIGHT KNEE - COMPLETE 4+ VIEW COMPARISON:  None. FINDINGS: No fracture or dislocation is noted. Moderate suprapatellar joint effusion is noted. Moderate narrowing of patellofemoral space is noted with patella spurring. Mild narrowing of medial joint space is noted. IMPRESSION: Mild degenerative joint disease is noted medially. 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 Moderate suprapatellar joint effusion is noted. No definite fracture or dislocation is noted. Electronically Signed   By: Marijo Conception, M.D.   On: 04/11/2016 13:53    Assessment: 1.  Immobility secondary to severe bilateral knee pain. Plain films reveal moderate suprapatellar joint effusions bilaterally as well as degenerative findings, but  no fracture or dislocation. DDx includes inflammatory joint condition.  2. Severely impaired proprioception bilateral toes. 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 DDx includes B12 deficiency, peripheral vascular disease, possible toxic etiology (such as EtOH, but did not endorse a hx of heavy EtOH use).  3. Decreased lower extremity reflexes most likely secondary to chronic neuropathy. Given severe pain as the best explanation for his lower extremity movement limitations as well as preserved upper extremity reflexes, GBS is felt to be low on the DDx.   Recommendations: 1. MRI of lumbar spine.  2. Rheumatology consult. May need joint aspiration and or MRI of knee joints to assist with diagnosis. May improve with corticosteroids if the joint effusions are not infectious.  3. B12 level. 4. If immobility is not improved with diagnosis/management of knee joint effusions and pain, call Neurology for further assessment.   Electronically signed: Dr. Kerney Elbe 04/11/2016, 7:36 PM

## 2016-04-11 NOTE — ED Notes (Signed)
Patient made aware urine sample is needed. Urinal placed at bedside and encouraged patient to void when able.

## 2016-04-11 NOTE — Progress Notes (Signed)
Pharmacy Antibiotic Note  Glen [name] is a 76 y.o. male admitted on 04/11/2016 with sepsis.  Pharmacy has been consulted for Zosyn dosing.  SCr 3.82 elevated, CrCl ~ 20 ml/min. Previous SCr ~ 2.3 (08/25/15 and 02/20/16) AST/ALT and Tbilirubin elevated Lactic acid 2.04 WBC 20.7  Plan: Zosyn 3.375g IV over 30 min x1 dose stat, then 3.375 g IV Q8H infused over 4hrs. Follow up renal fxn, culture results, and clinical course.   Height: 5\' 10"  (177.8 cm) Weight: 236 lb (107 kg) IBW/kg (Calculated) : 73  Temp (24hrs), Avg:97.8 F (36.6 C), Min:97.8 F (36.6 C), Max:97.8 F (36.6 C)   Recent Labs Lab 04/11/16 1615 04/11/16 1630  WBC 20.7*  --   CREATININE 3.82*  --   LATICACIDVEN  --  2.04*    Estimated Creatinine Clearance: 20.8 mL/min (by C-G formula based on SCr of 3.82 mg/dL (H)).    Allergies  Allergen Reactions  . Atenolol     REACTION: bradycardia    Antimicrobials this admission: 1/4 Zosyn >>  Dose adjustments this admission:   Microbiology results: 1/4 BCx: sent 1/4 UCx: sent    Thank you for allowing pharmacy to be a part of this patient's care.  Gretta Arab PharmD, BCPS Pager [phone] 04/11/2016 5:48 PM

## 2016-04-11 NOTE — H&P (Addendum)
History and Physical  Glen [name] [id number] DOB: Aug 09, 1940 DOA: 04/11/2016  PCP: Eulas Post, MD  Patient coming from: home  Chief Complaint: weak  HPI:  76 year old man PMH coronary artery disease, hypertension, hyperlipidemia, no neurologic history, who presents to the emergency department with profound generalized weakness. Initial evaluation revealed acute kidney injury, frequent PVCs and nonsustained V. tach on telemetry, elevated transaminases, leukocytosis and patient was referred for admission, per EDP with concern for possible sepsis.  History obtained from patient as well as wife at bedside. Patient and wife are difficult historians, chronicity is very difficult to determine, it is very difficult to ascertain accurate dates, however the patient fell approximately one month ago, they believe December 8, he was shoveling snow, fell forward onto his hands and ribs. He did not hit his head, his back or bottom. He was able to immediately get up and for the next 3 weeks had no difficulty ambulating. He celebrated New Year's Eve without difficulty. He daily navigates a flight of stairs without difficulty normally. As best as I can ascertain, on New Year's Day the patient found himself profoundly weak, especially in his legs, unable to get up out of bed. From January 1-January 4 he has been in bed with the exception of one episode where he fell out of bed and the fire department came to put him back in bed. He came to the hospital today because his friend insisted he do so. Patient denies any bowel or bladder problems. No sacral dysesthesia. No paresthesias or numbness throughout his body. His only complaint is profound generalized weakness and bilateral knee pain, especially with flexing, he locates the pain behind his knees. He is unable to sit up in bed without assistance. He reports that he's been drinking very well and continuing to urinate normally. He's had no respiratory  symptoms or recent respiratory illness. No fever or chills. His review of systems except as above is unrevealing. He has no neurologic history of note.  The patient denies shortness of breath but his wife has noted tachypnea.  ED Course: Afebrile, hypertensive, respiratory rate 20s, heart rate 80s, telemetry shows sinus rhythm with very frequent PVCs and nonsustained V. tach. Treated with Zosyn and 3 L normal saline Pertinent labs: BUN 80, creatinine 3.82, potassium 3.4, AST 73, ALT 87, total CK 60, lactic acid 2.04, WBC 20.7 Imaging: CXR independently reviewed: No acute disease Bilateral knee films with effusions, no fracture identified.  Review of Systems:  Negative for fever, visual changes, rash, chest pain, SOB, dysuria, bleeding, n/v/abdominal pain.  Positive sore throat  Past Medical History:  Diagnosis Date  . Bradycardia    requiring discontinuation of b-blocker  . CAD (coronary artery disease) 2004   s/p MI, s/p stents LCX and prox LAD   . Chronic kidney disease    renal failure  . Hx of colonoscopy 05/15/2005  . Hyperlipidemia   . Hypertension   . Myocardial infarction    08/02/2002  . Obesity   . Renal insufficiency     Past Surgical History:  Procedure Laterality Date  . CARDIAC CATHETERIZATION    . CARDIAC CATHETERIZATION  2004   2 stents placed  . COLONOSCOPY    . POLYPECTOMY       reports that he has never smoked. He has never used smokeless tobacco. He reports that he drinks about 0.6 oz of alcohol per week . He reports that he does not use drugs.   Allergies  Allergen Reactions  .  Atenolol     REACTION: bradycardia    Family History  Problem Relation Age of Onset  . Aneurysm Father     brain  . Hypertension Father   . Heart attack Brother   . Alcohol abuse Brother      Prior to Admission medications   Medication Sig Start Date End Date Taking? Authorizing Provider  amLODipine (NORVASC) 10 MG tablet TAKE 1 TABLET EVERY DAY Patient taking  differently: TAKE 10 MG BY MOUTH EVERY DAY 08/07/15  Yes Eulas Post, MD  aspirin 81 MG tablet Take 1 tab daily Patient taking differently: Take 81 mg by mouth daily. Take 1 tab daily 08/03/10  Yes Jolaine Artist, MD  Levothyroxine Sodium 25 MCG CAPS Take 1 capsule (25 mcg total) by mouth daily before breakfast. 02/22/16  Yes Eulas Post, MD  losartan-hydrochlorothiazide (HYZAAR) 100-12.5 MG tablet TAKE 1 TABLET EVERY DAY 11/08/15  Yes Eulas Post, MD  multivitamin-lutein (OCUVITE-LUTEIN) CAPS capsule Take 1 capsule by mouth daily.   Yes Historical Provider, MD  pravastatin (PRAVACHOL) 80 MG tablet Take 1 tablet (80 mg total) by mouth daily. 01/31/16  Yes Minus Breeding, MD    Physical Exam: Vitals:   04/11/16 1700 04/11/16 1730 04/11/16 1805 04/11/16 1830  BP: 189/97 (!) 181/114 183/76 (!) 153/101  Pulse: (!) 58 87 77 83  Resp: (!) 27 20 (!) 28 19  Temp:      TempSrc:      SpO2: 97% 98% 96% 97%  Weight:      Height:        Constitutional:  . Seen in the emergency department. He appears calm but uncomfortable, ill. Eyes:  . Pupils and irises appear normal . Normal lids ENMT:  . external ears, nose appear normal . grossly normal hearing . Lips and tongue are dry. Neck:  . neck appears normal, no masses . no thyromegaly Respiratory:  . CTA bilaterally, no w/r/r.  . Tachypneic, shallow breathing, mild to moderate increased respiratory effort but able to speak in full sentences. Cardiovascular:  . RRR, no m/r/g . No LE extremity edema   Abdomen:  . Obese, soft, nontender, nondistended . No hernias noted Musculoskeletal:  . Bilateral upper extremities symmetrically slightly weak, 4/5. Tone is normal. Sensation grossly normal.  . Bilateral lower extremities profoundly weak. Unable to lift either off the bed. Able to wiggle toes. Able to assist examiner in lifting both legs but unable to maintain them in the air. Has significant pain with flexion of either knee.  No patellar reflex appreciated either leg. Sensation both legs intact. Skin:  . No rashes, lesions, ulcers . palpation of skin: no induration or nodules Neurologic:  . Cranial nerves appear intact . Sensation all 4 extremities intact Psychiatric:  . judgement and insight difficult to gauge . Mental status o Mood, affect difficult to assess  Wt Readings from Last 3 Encounters:  04/11/16 107 kg (236 lb)  03/06/16 107.3 kg (236 lb 9.6 oz)  02/28/16 107 kg (236 lb)    I have personally reviewed following labs and imaging studies  Labs on Admission:  CBC:  Recent Labs Lab 04/11/16 1615  WBC 20.7*  NEUTROABS 18.4*  HGB 15.0  HCT 41.7  MCV 85.8  PLT [id number]*   Basic Metabolic Panel:  Recent Labs Lab 04/11/16 1615  NA 144  K 3.4*  CL 109  CO2 21*  GLUCOSE 169*  BUN 80*  CREATININE 3.82*  CALCIUM 8.8*   Liver Function Tests:  Recent Labs Lab 04/11/16 1615  AST 73*  ALT 87*  ALKPHOS 182*  BILITOT 2.1*  PROT 7.6  ALBUMIN 3.1*   Cardiac Enzymes:  Recent Labs Lab 04/11/16 1621  CKTOTAL 60   Urine analysis:    Component Value Date/Time   COLORURINE AMBER (A) 04/11/2016 1735   APPEARANCEUR HAZY (A) 04/11/2016 1735   LABSPEC 1.017 04/11/2016 1735   PHURINE 5.0 04/11/2016 1735   GLUCOSEU 50 (A) 04/11/2016 1735   HGBUR SMALL (A) 04/11/2016 1735   BILIRUBINUR NEGATIVE 04/11/2016 1735   KETONESUR NEGATIVE 04/11/2016 1735   PROTEINUR >=300 (A) 04/11/2016 1735   NITRITE NEGATIVE 04/11/2016 1735   LEUKOCYTESUR NEGATIVE 04/11/2016 1735    Radiological Exams on Admission: Dg Chest 2 View  Result Date: 04/11/2016 CLINICAL DATA:  Fatigue EXAM: CHEST  2 VIEW COMPARISON:  None. FINDINGS: Mild cardiomegaly. No confluent airspace opacities or effusions. No edema. No acute bony abnormality. Surgical changes in the proximal left humerus. IMPRESSION: Cardiomegaly.  No active disease. Electronically Signed   By: Rolm Baptise M.D.   On: 04/11/2016 17:31   Dg Knee  Complete 4 Views Left  Result Date: 04/11/2016 CLINICAL DATA:  Bilateral knee pain after fall several weeks ago. EXAM: LEFT KNEE - COMPLETE 4+ VIEW COMPARISON:  None. FINDINGS: No definite fracture or dislocation is noted. Moderate suprapatellar joint effusion is noted. Spurring of patella is noted. Joint spaces appear to be intact. IMPRESSION: Moderate suprapatellar joint effusion. No definite evidence of fracture or dislocation. Electronically Signed   By: Marijo Conception, M.D.   On: 04/11/2016 13:51   Dg Knee Complete 4 Views Right  Result Date: 04/11/2016 CLINICAL DATA:  Bilateral knee pain after fall several weeks ago. EXAM: RIGHT KNEE - COMPLETE 4+ VIEW COMPARISON:  None. FINDINGS: No fracture or dislocation is noted. Moderate suprapatellar joint effusion is noted. Moderate narrowing of patellofemoral space is noted with patella spurring. Mild narrowing of medial joint space is noted. IMPRESSION: Mild degenerative joint disease is noted medially. 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 Moderate suprapatellar joint effusion is noted. No definite fracture or dislocation is noted. Electronically Signed   By: Marijo Conception, M.D.   On: 04/11/2016 13:53    EKG: Independently reviewed: Sinus rhythm, anterolateral infarct, old, no acute changes. Telemetry: As noted above frequent PVCs, nonsustained V. tach.  Active Problems:   Acute kidney injury [facility])   Assessment/Plan 76 year old man presents with profound generalized weakness of the lower extremities.  1. Profound generalized weakness of the lower extremities with tachypnea,  concerning for GB. No history of trauma or back pain. CK is normal.  Urgent neurology consult. Discussed with Dr. Cheral Marker who will see in consult here at [place] STAT FVC and NIF.   Based on neuro assessment and above, will determine appropriate dispo, may need ICU bed at [facility].  Discussed above with ED nurse to expedite workup 2. Acute kidney  injury, prerenal pattern. Likely secondary to poor oral intake at home.  Aggressive IV fluids 3. Frequent PVCs. Hemodynamics stable.  Check Mg, replace K+  Continue telemetry. 4. Leukocytosis, suspect hemoconcentration. Urine negative. Chest x-ray negative. No signs or symptoms of bacterial infection. Follow clinically.  5. Elevated lactic acid, minimal. Secondary to acute kidney injury. There are no signs or symptoms to suggest sepsis. 6. Elevated transaminases, unclear significance. Recheck CMP in the morning.  Will d/w night coverage. Disposition, both facility and bed will follow neuro recs. Night coverage will follow.   It  is my clinical opinion that admission to INPATIENT is reasonable and necessary in this patient   DVT prophylaxis:SCDs Code Status: full code Family Communication: wife and son at bedside Consults called: neurology     Time spent: 60 minutes  Murray Hodgkins, MD  [facility] Direct contact: [phone] --[place] [place]  --[email or web address]; password [email or web address]  7PM-7AM contact night coverage as above  04/11/2016, 6:55 PM

## 2016-04-11 NOTE — ED Notes (Signed)
ED Provider at bedside. 

## 2016-04-11 NOTE — ED Notes (Signed)
Bed: WA24 Expected date:  Expected time:  Means of arrival:  Comments: Pt getting dressed; hall C will go in this room

## 2016-04-11 NOTE — ED Notes (Signed)
Patient transported to X-ray 

## 2016-04-11 NOTE — ED Provider Notes (Signed)
[facility] DEPT Provider Note   CSN: [id number] Arrival date & time: 04/11/16  1242     History   Chief Complaint Chief Complaint  Patient presents with  . Knee Pain    Bilateral  . Multiple Complaints    HPI [name] is a 76 y.o. male.  HPI    76 year old male with a history of coronary artery disease, chronic kidney disease, hypertension, hyperlipidemia presents with concern for generalized weakness and urinary frequency over the last week and a half.  Patient reports that he fell in December, however was initially able to walk around, and reports that he thinks that due to the cold over the last week, he's been unable to get out of bed with severe generalized weakness, reports he has become "an invalid."    [facility] December 8th weekend on ice, did not hit head. Feel forward onto knees and chest. Had black and blue marks on rib cage and knee caps. Thought something was wrong with knees but was able to walk around for 2 weeks following fall.  No head trauma, headaches, neck pain nor back pain. No nausea or vomiting.  Initially after fall was doing ok, however for the last 1.5 weeks has been generally weak, fatigued Since 1/1 has been laying in bed (the fire department helped him get into bed on Tuesday from chair and then he has not moved since then)  Urinary symptoms, frequency for unclear amount of time, reports had urination at night before, but reports now every time he drinkins he needs his wife to bring him something to urinate in. Denies other symptoms.    Past Medical History:  Diagnosis Date  . Bradycardia    requiring discontinuation of b-blocker  . CAD (coronary artery disease) 2004   s/p MI, s/p stents LCX and prox LAD   . Chronic kidney disease    renal failure  . Hx of colonoscopy 05/15/2005  . Hyperlipidemia   . Hypertension   . Myocardial infarction    08/02/2002  . Obesity   . Renal insufficiency     Patient Active Problem List   Diagnosis  Date Noted  . Acute kidney injury ([place]) 04/11/2016  . Squamous cell skin cancer 03/06/2016  . CKD (chronic kidney disease) stage 4, GFR 15-29 ml/min (HCC) 08/25/2015  . OBESITY 01/25/2009  . Hyperlipidemia 10/28/2006  . Essential hypertension 10/28/2006  . Coronary atherosclerosis 10/28/2006    Past Surgical History:  Procedure Laterality Date  . CARDIAC CATHETERIZATION    . CARDIAC CATHETERIZATION  2004   2 stents placed  . COLONOSCOPY    . POLYPECTOMY         Home Medications    Prior to Admission medications   Medication Sig Start Date End Date Taking? Authorizing Provider  amLODipine (NORVASC) 10 MG tablet TAKE 1 TABLET EVERY DAY Patient taking differently: TAKE 10 MG BY MOUTH EVERY DAY 08/07/15  Yes Eulas Post, MD  aspirin 81 MG tablet Take 1 tab daily Patient taking differently: Take 81 mg by mouth daily. Take 1 tab daily 08/03/10  Yes Jolaine Artist, MD  Levothyroxine Sodium 25 MCG CAPS Take 1 capsule (25 mcg total) by mouth daily before breakfast. 02/22/16  Yes Eulas Post, MD  losartan-hydrochlorothiazide (HYZAAR) 100-12.5 MG tablet TAKE 1 TABLET EVERY DAY 11/08/15  Yes Eulas Post, MD  multivitamin-lutein (OCUVITE-LUTEIN) CAPS capsule Take 1 capsule by mouth daily.   Yes Historical Provider, MD  pravastatin (PRAVACHOL) 80 MG tablet Take  1 tablet (80 mg total) by mouth daily. 01/31/16  Yes Minus Breeding, MD    Family History Family History  Problem Relation Age of Onset  . Aneurysm Father     brain  . Hypertension Father   . Heart attack Brother   . Alcohol abuse Brother     Social History Social History  Substance Use Topics  . Smoking status: Never Smoker  . Smokeless tobacco: Never Used  . Alcohol use 0.6 oz/week    1 Glasses of wine per week     Allergies   Atenolol   Review of Systems Review of Systems  Constitutional: Negative for fever.  HENT: Negative for congestion.   Respiratory: Negative for cough and shortness of  breath.   Cardiovascular: Negative for chest pain.  Gastrointestinal: Positive for constipation. Negative for abdominal pain, blood in stool, nausea and vomiting.  Endocrine: Positive for polydipsia and polyuria.  Genitourinary: Positive for frequency. Negative for dysuria.  Musculoskeletal: Positive for arthralgias. Negative for back pain and neck pain.  Skin: Negative for rash.  Neurological: Negative for syncope, speech difficulty, weakness, numbness and headaches.     Physical Exam Updated Vital Signs BP 183/76   Pulse 77   Temp 97.8 F (36.6 C) (Oral)   Resp (!) 28   Ht 5\' 10"  (1.778 m)   Wt 236 lb (107 kg)   SpO2 96%   BMI 33.86 kg/m   Physical Exam  Constitutional: He is oriented to person, place, and time. He appears well-developed and well-nourished. He has a sickly appearance. He appears ill. No distress.  HENT:  Head: Normocephalic and atraumatic.  Dry mucous membranes   Eyes: Conjunctivae and EOM are normal.  Neck: Normal range of motion.  Cardiovascular: Normal rate, regular rhythm, normal heart sounds and intact distal pulses.  Exam reveals no gallop and no friction rub.   No murmur heard. Ectopic beats  Pulmonary/Chest: Effort normal and breath sounds normal. No respiratory distress. He has no wheezes. He has no rales.  Abdominal: Soft. He exhibits no distension. There is no tenderness. There is no guarding.  Musculoskeletal: He exhibits no edema.  Neurological: He is alert and oriented to person, place, and time. He displays tremor (reports chronic). No cranial nerve deficit or sensory deficit. GCS eye subscore is 4. GCS verbal subscore is 5. GCS motor subscore is 6.  Severe generalized weakness bilateral upper and lower extremities with weakness worse in Lower Ext. unable to sit up independently. Able to hold up legs briefly before dropping. No pronator drift with UE however some weakness with direct strength testing.  Skin: Skin is warm and dry. He is not  diaphoretic.  Nursing note and vitals reviewed.    ED Treatments / Results  Labs (all labs ordered are listed, but only abnormal results are displayed) Labs Reviewed  CBC WITH DIFFERENTIAL/PLATELET - Abnormal; Notable for the following:       Result Value   WBC 20.7 (*)    Platelets 421 (*)    Neutro Abs 18.4 (*)    Monocytes Absolute 1.1 (*)    All other components within normal limits  COMPREHENSIVE METABOLIC PANEL - Abnormal; Notable for the following:    Potassium 3.4 (*)    CO2 21 (*)    Glucose, Bld 169 (*)    BUN 80 (*)    Creatinine, Ser 3.82 (*)    Calcium 8.8 (*)    Albumin 3.1 (*)    AST 73 (*)  ALT 87 (*)    Alkaline Phosphatase 182 (*)    Total Bilirubin 2.1 (*)    GFR calc non Af Amer 14 (*)    GFR calc Af Amer 17 (*)    All other components within normal limits  I-STAT CG4 LACTIC ACID, ED - Abnormal; Notable for the following:    Lactic Acid, Venous 2.04 (*)    All other components within normal limits  URINE CULTURE  CULTURE, BLOOD (ROUTINE X 2)  CULTURE, BLOOD (ROUTINE X 2)  CK  URINALYSIS, ROUTINE W REFLEX MICROSCOPIC  TSH  T4, FREE  I-STAT TROPOININ, ED    EKG  EKG Interpretation  Date/Time:  Thursday April 11 2016 15:49:33 EST Ventricular Rate:  82 PR Interval:    QRS Duration: 116 QT Interval:  394 QTC Calculation: 461 R Axis:   -70 Text Interpretation:  Sinus rhythm Nonspecific IVCD with LAD Anterolateral infarct, old No significant change since last tracing Confirmed by [facility] MD, [place] ([id number]) on 04/11/2016 5:28:10 PM       Radiology Dg Chest 2 View  Result Date: 04/11/2016 CLINICAL DATA:  Fatigue EXAM: CHEST  2 VIEW COMPARISON:  None. FINDINGS: Mild cardiomegaly. No confluent airspace opacities or effusions. No edema. No acute bony abnormality. Surgical changes in the proximal left humerus. IMPRESSION: Cardiomegaly.  No active disease. Electronically Signed   By: Rolm Baptise M.D.   On: 04/11/2016 17:31   Dg Knee Complete 4  Views Left  Result Date: 04/11/2016 CLINICAL DATA:  Bilateral knee pain after fall several weeks ago. EXAM: LEFT KNEE - COMPLETE 4+ VIEW COMPARISON:  None. FINDINGS: No definite fracture or dislocation is noted. Moderate suprapatellar joint effusion is noted. Spurring of patella is noted. Joint spaces appear to be intact. IMPRESSION: Moderate suprapatellar joint effusion. No definite evidence of fracture or dislocation. Electronically Signed   By: Marijo Conception, M.D.   On: 04/11/2016 13:51   Dg Knee Complete 4 Views Right  Result Date: 04/11/2016 CLINICAL DATA:  Bilateral knee pain after fall several weeks ago. EXAM: RIGHT KNEE - COMPLETE 4+ VIEW COMPARISON:  None. FINDINGS: No fracture or dislocation is noted. Moderate suprapatellar joint effusion is noted. Moderate narrowing of patellofemoral space is noted with patella spurring. Mild narrowing of medial joint space is noted. IMPRESSION: Mild degenerative joint disease is noted medially. 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 Moderate suprapatellar joint effusion is noted. No definite fracture or dislocation is noted. Electronically Signed   By: Marijo Conception, M.D.   On: 04/11/2016 13:53    Procedures Procedures (including critical care time)  Medications Ordered in ED Medications  piperacillin-tazobactam (ZOSYN) IVPB 3.375 g (3.375 g Intravenous New Bag/Given 04/11/16 1805)  piperacillin-tazobactam (ZOSYN) IVPB 3.375 g (not administered)  sodium chloride 0.9 % bolus 1,000 mL (0 mLs Intravenous Stopped 04/11/16 1725)  sodium chloride 0.9 % bolus 1,000 mL (1,000 mLs Intravenous New Bag/Given 04/11/16 1725)  sodium chloride 0.9 % bolus 1,000 mL (1,000 mLs Intravenous New Bag/Given 04/11/16 1726)     Initial Impression / Assessment and Plan / ED Course  I have reviewed the triage vital signs and the nursing notes.  Pertinent labs & imaging results that were available during my care of the patient were reviewed by me and  considered in my medical decision making (see chart for details).  Clinical Course    76 year old male with a history of coronary artery disease, chronic kidney disease, hypertension, hyperlipidemia presents with concern for generalized weakness and urinary  frequency over the last week and a half.  No signs of other injury from fall, no cervical, thoracic nor lumbar pain, no headaches, no n/v. Has knee pain, had been ambulatory after fall, no sign of fx on XR and suspect other arthritis/contusion. No erythema or sign of septic arthritis. Patient without focal weakness on exam, was a suspicion for CVA. His weakness is more generalized, with no back pain, doubt lumbar spine injury. Obtained normal reflex on left, generalized weakness, and have low suspicion for Guillain Barre.  Labs significant for acute on chronic kidney injury, leukocytosis of 20,000, lactic acidosis of 2.04.  LFTs elevated however no RUQ tenderness and doubt cholecystitis/choledocolithiasis. Possible urinary source of infection given urinary frequency. CXR ordered and pending.  Ordered blood cultures, and Zosyn empirically as we are awaiting urinalysis.  Lactic acidosis and acute kidney injury may also be secondary to severe dehydration.  Given laboratory abnormalities, concern for possible sepsis, severe generalized weakness, patient requires admission, IV hydration, continued work up.   Final Clinical Impressions(s) / ED Diagnoses   Final diagnoses:  Acute pain of both knees  Generalized weakness  Leukocytosis, unspecified type  Acute renal failure superimposed on stage 4 chronic kidney disease, unspecified acute renal failure type (HCC)  Lactic acidosis    New Prescriptions New Prescriptions   No medications on file     Gareth Morgan, MD 04/11/16 1831

## 2016-04-11 NOTE — ED Notes (Signed)
Upon researching why patient hasn't had a bed assignment, it was found that patient needed a neurology consult, this order was written in the same order as a respiratory order, the neurology consult order should have been a separate order.  Neurology was paged and the situation explained, Dr Cheral Marker will be here to consult and evaluate patient.  The delay was explained to the patient.

## 2016-04-11 NOTE — ED Notes (Signed)
Bed: [facility] Expected date:  Expected time:  Means of arrival:  Comments: EMS- 76yo M, knee pain

## 2016-04-11 NOTE — Progress Notes (Signed)
Pt gave great effort with FVC and NIF maneuvers.  Best of several attempts: NIF -44, FVC 2.4 L.  RN notified.

## 2016-04-11 NOTE — ED Notes (Signed)
2 sets of blood cultures obtained prior to antibiotic administration 

## 2016-04-11 NOTE — ED Notes (Signed)
Hospitalist at bedside 

## 2016-04-11 NOTE — ED Notes (Signed)
Hospitalist states he no longer wants the patient to go to a telemetry bed. He wants patient to go to stepdown.

## 2016-04-11 NOTE — ED Triage Notes (Addendum)
Per EMS, patient reports he has been in bed for 5 days due to bilateral knee pain. Patient is complaining of "weakness because he has not been out of bed." Patient reports he fell 2 weeks before Christmas and was not seen for the fall. States "I fell forward on my belly." Patient also reports he has not had a BM in 5 days. Denies abdominal pain/nausea/vomiting/diarrhea. Patient is from home.

## 2016-04-11 NOTE — ED Notes (Signed)
Lactic Acid = 2.04, MD Schlossman notified

## 2016-04-12 ENCOUNTER — Inpatient Hospital Stay (HOSPITAL_COMMUNITY): Payer: Medicare HMO

## 2016-04-12 DIAGNOSIS — R531 Weakness: Secondary | ICD-10-CM

## 2016-04-12 DIAGNOSIS — M5137 Other intervertebral disc degeneration, lumbosacral region: Secondary | ICD-10-CM

## 2016-04-12 DIAGNOSIS — N179 Acute kidney failure, unspecified: Secondary | ICD-10-CM | POA: Diagnosis present

## 2016-04-12 DIAGNOSIS — D72829 Elevated white blood cell count, unspecified: Secondary | ICD-10-CM

## 2016-04-12 DIAGNOSIS — I10 Essential (primary) hypertension: Secondary | ICD-10-CM

## 2016-04-12 DIAGNOSIS — R9431 Abnormal electrocardiogram [ECG] [EKG]: Secondary | ICD-10-CM

## 2016-04-12 LAB — ECHOCARDIOGRAM COMPLETE
AOASC: 36 cm
AVLVOTPG: 8 mmHg
CHL CUP DOP CALC LVOT VTI: 24.5 cm
CHL CUP MV DEC (S): 299
E/e' ratio: 6.35
EWDT: 299 ms
FS: 36 % (ref 28–44)
Height: 72 in
IV/PV OW: 1.33
LA ID, A-P, ES: 45 mm
LA diam end sys: 45 mm
LADIAMINDEX: 1.96 cm/m2
LAVOLA4C: 72.6 mL
LV E/e' medial: 6.35
LV PW d: 12.2 mm — AB (ref 0.6–1.1)
LV TDI E'LATERAL: 9.17
LV TDI E'MEDIAL: 6.84
LV e' LATERAL: 9.17 cm/s
LVEEAVG: 6.35
LVOT SV: 102 mL
LVOT area: 4.15 cm2
LVOT peak vel: 139 cm/s
LVOTD: 23 mm
Lateral S' vel: 15.7 cm/s
MVPKAVEL: 111 m/s
MVPKEVEL: 58.2 m/s
TAPSE: 24.4 mm
WEIGHTICAEL: 3587.33 [oz_av]

## 2016-04-12 LAB — CBC
HCT: 40.3 % (ref 39.0–52.0)
HEMOGLOBIN: 14.3 g/dL (ref 13.0–17.0)
MCH: 30.7 pg (ref 26.0–34.0)
MCHC: 35.5 g/dL (ref 30.0–36.0)
MCV: 86.5 fL (ref 78.0–100.0)
PLATELETS: 396 10*3/uL (ref 150–400)
RBC: 4.66 MIL/uL (ref 4.22–5.81)
RDW: 13.9 % (ref 11.5–15.5)
WBC: 16.3 10*3/uL — AB (ref 4.0–10.5)

## 2016-04-12 LAB — COMPREHENSIVE METABOLIC PANEL
ALK PHOS: 153 U/L — AB (ref 38–126)
ALT: 72 U/L — AB (ref 17–63)
AST: 77 U/L — AB (ref 15–41)
Albumin: 2.8 g/dL — ABNORMAL LOW (ref 3.5–5.0)
Anion gap: 16 — ABNORMAL HIGH (ref 5–15)
BUN: 81 mg/dL — AB (ref 6–20)
CALCIUM: 8.2 mg/dL — AB (ref 8.9–10.3)
CHLORIDE: 116 mmol/L — AB (ref 101–111)
CO2: 16 mmol/L — AB (ref 22–32)
CREATININE: 3.53 mg/dL — AB (ref 0.61–1.24)
GFR calc non Af Amer: 16 mL/min — ABNORMAL LOW (ref >60)
GFR, EST AFRICAN AMERICAN: 18 mL/min — AB (ref >60)
Glucose, Bld: 146 mg/dL — ABNORMAL HIGH (ref 65–99)
Potassium: 3.6 mmol/L (ref 3.5–5.1)
Sodium: 148 mmol/L — ABNORMAL HIGH (ref 135–145)
Total Bilirubin: 1.9 mg/dL — ABNORMAL HIGH (ref 0.3–1.2)
Total Protein: 6.5 g/dL (ref 6.5–8.1)

## 2016-04-12 LAB — MRSA PCR SCREENING: MRSA by PCR: NEGATIVE

## 2016-04-12 MED ORDER — HYDRALAZINE HCL 20 MG/ML IJ SOLN
20.0000 mg | Freq: Four times a day (QID) | INTRAMUSCULAR | Status: DC | PRN
  Administered 2016-04-12: 20 mg via INTRAVENOUS
  Filled 2016-04-12: qty 1

## 2016-04-12 MED ORDER — LEVOTHYROXINE SODIUM 25 MCG PO CAPS: 25.0000 ug | ORAL_CAPSULE | Freq: Every day | ORAL | Status: DC

## 2016-04-12 MED ORDER — LEVOTHYROXINE SODIUM 25 MCG PO TABS
25.0000 ug | ORAL_TABLET | Freq: Every day | ORAL | Status: DC
  Administered 2016-04-13 – 2016-04-20 (×8): 25 ug via ORAL
  Filled 2016-04-12 (×8): qty 1

## 2016-04-12 MED ORDER — HYDROCHLOROTHIAZIDE 12.5 MG PO CAPS
12.5000 mg | ORAL_CAPSULE | Freq: Every day | ORAL | Status: DC
  Administered 2016-04-13 – 2016-04-17 (×5): 12.5 mg via ORAL
  Filled 2016-04-12 (×5): qty 1

## 2016-04-12 MED ORDER — ASPIRIN EC 81 MG PO TBEC
81.0000 mg | DELAYED_RELEASE_TABLET | Freq: Every day | ORAL | Status: DC
  Administered 2016-04-12 – 2016-04-20 (×9): 81 mg via ORAL
  Filled 2016-04-12 (×9): qty 1

## 2016-04-12 MED ORDER — LOSARTAN POTASSIUM 50 MG PO TABS
100.0000 mg | ORAL_TABLET | Freq: Every day | ORAL | Status: DC
  Administered 2016-04-13 – 2016-04-16 (×4): 100 mg via ORAL
  Filled 2016-04-12 (×4): qty 2

## 2016-04-12 MED ORDER — OCUVITE-LUTEIN PO CAPS
1.0000 | ORAL_CAPSULE | Freq: Every day | ORAL | Status: DC
  Administered 2016-04-13 – 2016-04-20 (×8): 1 via ORAL
  Filled 2016-04-12 (×8): qty 1

## 2016-04-12 MED ORDER — PRAVASTATIN SODIUM 40 MG PO TABS
80.0000 mg | ORAL_TABLET | Freq: Every day | ORAL | Status: DC
  Administered 2016-04-12 – 2016-04-16 (×5): 80 mg via ORAL
  Filled 2016-04-12 (×5): qty 2

## 2016-04-12 MED ORDER — HEPARIN SODIUM (PORCINE) 5000 UNIT/ML IJ SOLN
5000.0000 [IU] | Freq: Three times a day (TID) | INTRAMUSCULAR | Status: DC
  Administered 2016-04-12 – 2016-04-20 (×25): 5000 [IU] via SUBCUTANEOUS
  Filled 2016-04-12 (×25): qty 1

## 2016-04-12 MED ORDER — SODIUM CHLORIDE 0.9 % IV SOLN
INTRAVENOUS | Status: DC
  Administered 2016-04-12 (×2): via INTRAVENOUS

## 2016-04-12 MED ORDER — LOSARTAN POTASSIUM-HCTZ 100-12.5 MG PO TABS: 1.0000 | ORAL_TABLET | Freq: Every day | ORAL | Status: DC

## 2016-04-12 MED ORDER — AMLODIPINE BESYLATE 10 MG PO TABS
10.0000 mg | ORAL_TABLET | Freq: Every day | ORAL | Status: DC
  Administered 2016-04-12 – 2016-04-20 (×9): 10 mg via ORAL
  Filled 2016-04-12 (×9): qty 1

## 2016-04-12 NOTE — Progress Notes (Signed)
  Echocardiogram 2D Echocardiogram has been performed.  [name] 04/12/2016, 10:55 AM

## 2016-04-12 NOTE — Progress Notes (Signed)
PROGRESS NOTE    [name]  [id number] DOB: 12/06/1940 DOA: 04/11/2016 PCP: Eulas Post, MD   Brief Narrative:  76 yo male with HTN, coronary artery disease, dyslipidemia, presents with lower extremities weakness and bilateral knee pain, unable to ambulate for the last 4 days before admission. On the physical exam found hemodynamically stable, his cr was elevated as well as wbc. Received one dose of Zosyn and was admitted to the ICU for further evaluation. Neurology consulted and requested MRI of the back.      Assessment & Plan:   Active Problems:   Acute kidney injury ([place])   AKI (acute kidney injury) ([place])   1. Lower extremity weakness. MRI reported with degenerative disc disease, will continue pain control and physical therapy. No clinical signs of neuropathy.   2. Leukocytosis. No signs of infection, probable reactive, will continue to hold on antibiotic therapy. Follow cell count in am.   3. AKI on CKD stage IV. Improved renal function with cr down to 3,5 from 3,8. Suspected CKD with calculated GFR of 20 bases on cr 2,8 on December 2017. Will decrease rate of IV fluids to 75 cc.hr.  4, HTN. Will continue hydralazoni IV as need for uncontrolled blood pressure. Systolic [id number] to 0000000. Clinically euvolemic.   5. Frequent premature ventricular complexes. Will check echocardiography to rule out structural heart disease, ekg personally reviewed noted sinus with interventricular conduction delay with poor precordial r wave porgression.     DVT prophylaxis: heparin  Code Status: full  Family Communication: No family at the bedside  Disposition Plan: Home   Consultants:   Neurology   Procedures:   Antimicrobials:  Zosyn one dose    Subjective: Patient is feeling better, still very weak on the lower extremities, with positive pain bilateral knee, moderate in intensity, worse with movement, no radiation, dull in nature.   Objective: Vitals:   04/12/16 0600  04/12/16 0630 04/12/16 0700 04/12/16 0730  BP: (!) 159/93 (!) 168/89 135/72 99/80  Pulse: 78 69 76 75  Resp: (!) 28 (!) 28 (!) 26 19  Temp:      TempSrc:      SpO2: 96% 96% 96% 97%  Weight:      Height:        Intake/Output Summary (Last 24 hours) at 04/12/16 0804 Last data filed at 04/12/16 0700  Gross per 24 hour  Intake             1750 ml  Output              825 ml  Net              925 ml   Filed Weights   04/11/16 1250 04/12/16 0131  Weight: 107 kg (236 lb) 101.7 kg (224 lb 3.3 oz)    Examination:  General exam: not in pain or dyspnea E ENT: mild pallor, oral mucosa dry.  Respiratory system: Clear to auscultation. Respiratory effort normal. No wheezing, rales or rhonchi.  Cardiovascular system: S1 & S2 heard, RRR. No JVD, murmurs, rubs, gallops or clicks. No pedal edema. Gastrointestinal system: Abdomen is nondistended, soft and nontender. No organomegaly or masses felt. Normal bowel sounds heard. Central nervous system: Alert and oriented. No focal neurological deficits. Extremities: Symmetric 5 x 5 power. Bilateral knee with hypertrophy, no erythema or edema, mild tender to palpation, range of motion is decreased due to pain.  Skin: No rashes, lesions or ulcers  Data Reviewed: I have personally reviewed  following labs and imaging studies  CBC:  Recent Labs Lab 04/11/16 1615 04/12/16 0320  WBC 20.7* 16.3*  NEUTROABS 18.4*  --   HGB 15.0 14.3  HCT 41.7 40.3  MCV 85.8 86.5  PLT 421* [id number]   Basic Metabolic Panel:  Recent Labs Lab 04/11/16 1615 04/11/16 1935 04/12/16 0320  NA 144  --  148*  K 3.4*  --  3.6  CL 109  --  116*  CO2 21*  --  16*  GLUCOSE 169*  --  146*  BUN 80*  --  81*  CREATININE 3.82*  --  3.53*  CALCIUM 8.8*  --  8.2*  MG  --  2.4  --   PHOS  --  4.0  --    GFR: Estimated Creatinine Clearance: 22.6 mL/min (by C-G formula based on SCr of 3.53 mg/dL (H)). Liver Function Tests:  Recent Labs Lab 04/11/16 1615 04/12/16 0320    AST 73* 77*  ALT 87* 72*  ALKPHOS 182* 153*  BILITOT 2.1* 1.9*  PROT 7.6 6.5  ALBUMIN 3.1* 2.8*   No results for input(s): LIPASE, AMYLASE in the last 168 hours. No results for input(s): AMMONIA in the last 168 hours. Coagulation Profile: No results for input(s): INR, PROTIME in the last 168 hours. Cardiac Enzymes:  Recent Labs Lab 04/11/16 1621  CKTOTAL 60   BNP (last 3 results) No results for input(s): PROBNP in the last 8760 hours. HbA1C: No results for input(s): HGBA1C in the last 72 hours. CBG: No results for input(s): GLUCAP in the last 168 hours. Lipid Profile: No results for input(s): CHOL, HDL, LDLCALC, TRIG, CHOLHDL, LDLDIRECT in the last 72 hours. Thyroid Function Tests:  Recent Labs  04/11/16 1746  TSH 3.334  FREET4 1.32*   Anemia Panel: No results for input(s): VITAMINB12, FOLATE, FERRITIN, TIBC, IRON, RETICCTPCT in the last 72 hours. Sepsis Labs:  Recent Labs Lab 04/11/16 1630 04/11/16 1954  LATICACIDVEN 2.04* 1.73    Recent Results (from the past 240 hour(s))  MRSA PCR Screening     Status: None   Collection Time: 04/12/16  1:23 AM  Result Value Ref Range Status   MRSA by PCR NEGATIVE NEGATIVE Final    Comment:        The GeneXpert MRSA Assay (FDA approved for NASAL specimens only), is one component of a comprehensive MRSA colonization surveillance program. It is not intended to diagnose MRSA infection nor to guide or monitor treatment for MRSA infections.          Radiology Studies: Dg Chest 2 View  Result Date: 04/11/2016 CLINICAL DATA:  Fatigue EXAM: CHEST  2 VIEW COMPARISON:  None. FINDINGS: Mild cardiomegaly. No confluent airspace opacities or effusions. No edema. No acute bony abnormality. Surgical changes in the proximal left humerus. IMPRESSION: Cardiomegaly.  No active disease. Electronically Signed   By: Rolm Baptise M.D.   On: 04/11/2016 17:31   Dg Knee Complete 4 Views Left  Result Date: 04/11/2016 CLINICAL DATA:   Bilateral knee pain after fall several weeks ago. EXAM: LEFT KNEE - COMPLETE 4+ VIEW COMPARISON:  None. FINDINGS: No definite fracture or dislocation is noted. Moderate suprapatellar joint effusion is noted. Spurring of patella is noted. Joint spaces appear to be intact. IMPRESSION: Moderate suprapatellar joint effusion. No definite evidence of fracture or dislocation. Electronically Signed   By: Marijo Conception, M.D.   On: 04/11/2016 13:51   Dg Knee Complete 4 Views Right  Result Date: 04/11/2016 CLINICAL DATA:  Bilateral  knee pain after fall several weeks ago. EXAM: RIGHT KNEE - COMPLETE 4+ VIEW COMPARISON:  None. FINDINGS: No fracture or dislocation is noted. Moderate suprapatellar joint effusion is noted. Moderate narrowing of patellofemoral space is noted with patella spurring. Mild narrowing of medial joint space is noted. IMPRESSION: Mild degenerative joint disease is noted medially. 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 Moderate suprapatellar joint effusion is noted. No definite fracture or dislocation is noted. Electronically Signed   By: Marijo Conception, M.D.   On: 04/11/2016 13:53        Scheduled Meds: . sodium chloride flush  3 mL Intravenous Q12H   Continuous Infusions: . sodium chloride 100 mL/hr at 04/12/16 0127     LOS: 1 day      Tawni Millers, MD [facility] Pager [phone]  If 7PM-7AM, please contact night-coverage [email or web address] Password [email or web address] 04/12/2016, 8:04 AM

## 2016-04-12 NOTE — Progress Notes (Signed)
I spoke to the patient at the bedside in the ED at [facility] regarding transfer to [name] to expedite neurology evaluation and MRI imaging.  The patient has refused transfer.  I further explained that a refusal to transfer would contribute to delays in diagnosis, evaluation, and treatment.  He has refused transfer to [facility], stating "what difference will one more night make?".  He is awake and alert, oriented x 4, expresses concerns about how his wife would react to a transfer "in the middle of the night", and appears competent to make his own decisions.

## 2016-04-13 LAB — BASIC METABOLIC PANEL
ANION GAP: 12 (ref 5–15)
BUN: 94 mg/dL — ABNORMAL HIGH (ref 6–20)
CALCIUM: 7.9 mg/dL — AB (ref 8.9–10.3)
CHLORIDE: 119 mmol/L — AB (ref 101–111)
CO2: 16 mmol/L — AB (ref 22–32)
Creatinine, Ser: 3.55 mg/dL — ABNORMAL HIGH (ref 0.61–1.24)
GFR calc non Af Amer: 16 mL/min — ABNORMAL LOW (ref >60)
GFR, EST AFRICAN AMERICAN: 18 mL/min — AB (ref >60)
Glucose, Bld: 139 mg/dL — ABNORMAL HIGH (ref 65–99)
POTASSIUM: 3.4 mmol/L — AB (ref 3.5–5.1)
Sodium: 147 mmol/L — ABNORMAL HIGH (ref 135–145)

## 2016-04-13 LAB — CBC WITH DIFFERENTIAL/PLATELET
BASOS ABS: 0 10*3/uL (ref 0.0–0.1)
BASOS PCT: 0 %
Eosinophils Absolute: 0.2 10*3/uL (ref 0.0–0.7)
Eosinophils Relative: 1 %
HEMATOCRIT: 34.7 % — AB (ref 39.0–52.0)
HEMOGLOBIN: 12.1 g/dL — AB (ref 13.0–17.0)
LYMPHS PCT: 11 %
Lymphs Abs: 1.3 10*3/uL (ref 0.7–4.0)
MCH: 30.6 pg (ref 26.0–34.0)
MCHC: 34.9 g/dL (ref 30.0–36.0)
MCV: 87.8 fL (ref 78.0–100.0)
Monocytes Absolute: 0.6 10*3/uL (ref 0.1–1.0)
Monocytes Relative: 5 %
NEUTROS ABS: 9.6 10*3/uL — AB (ref 1.7–7.7)
NEUTROS PCT: 83 %
Platelets: 349 10*3/uL (ref 150–400)
RBC: 3.95 MIL/uL — AB (ref 4.22–5.81)
RDW: 14.3 % (ref 11.5–15.5)
WBC: 11.8 10*3/uL — AB (ref 4.0–10.5)

## 2016-04-13 LAB — URINE CULTURE: Culture: NO GROWTH

## 2016-04-13 NOTE — Progress Notes (Signed)
PROGRESS NOTE    SKEET ROSENBOOM  [id number] DOB: May 10, 1940 DOA: 04/11/2016 PCP: Eulas Post, MD   Brief Narrative:  76 yo male with HTN, coronary artery disease, dyslipidemia, presents with lower extremities weakness and bilateral knee pain, unable to ambulate for the last 4 days before admission. On the physical exam found hemodynamically stable, his cr was elevated as well as wbc. Received one dose of Zosyn and was admitted to the ICU for further evaluation. Neurology consulted and requested MRI of the back. MRI with significant disc disease. Patient able to stand with significant assistance from physical therapy.     Assessment & Plan:   Active Problems:   Acute kidney injury ([facility])   AKI (acute kidney injury) ([place])   1. Lower extremity weakness. MRI reported with degenerative disc disease, will continue pain control. Patient able to work with physical therapy, able to stand with difficulty. Continue fall precautions, may need SNF.   2. Leukocytosis. No signs of infection, probable reactive, will continue to hold on antibiotic therapy. WBC down to 11.8.  3. AKI on CKD stage IV. Patient tolerating po well, will hold on IV fluids, will continue to monitor renal function, cr down to 3,5 from peak 3,8. K at 3.4 and serum bicarbonate at 16 - Cl 119.    4, HTN. Hydralazone IV as need for uncontrolled blood pressure. Systolic blood pressure [id number], will continue amlodipine, losartan/ hctz combination.   5. Frequent premature ventricular complexes. Personally reviewed ekg with normal sinus rhythm, echocardiography with LVH and normal systolic function. Will keep telemetry for the next 24 hours if no VT, will discontinue.   DVT prophylaxis: heparin  Code Status: full  Family Communication: No family at the bedside  Disposition Plan: Home or snf    Consultants:   Neurology   Procedures:    Antimicrobials:       Subjective: Patient feeling better, pain has  improved. No chest pain or dyspnea. Able to stand with significant assistance of physical therapy, not able to ambulate yet. No fever or chills, no nausea or vomiting.   Objective: Vitals:   04/12/16 1715 04/12/16 1822 04/12/16 2053 04/13/16 0515  BP:  (!) 154/79 (!) 170/96 (!) 156/76  Pulse: 80 85 80 68  Resp: (!) 21 (!) 22 (!) 21 19  Temp:  97.7 F (36.5 C) 99.7 F (37.6 C) 97.5 F (36.4 C)  TempSrc:  Oral Oral Oral  SpO2: 96% 98% 97% 96%  Weight:      Height:        Intake/Output Summary (Last 24 hours) at 04/13/16 0947 Last data filed at 04/13/16 0700  Gross per 24 hour  Intake          2911.25 ml  Output             1175 ml  Net          1736.25 ml   Filed Weights   04/11/16 1250 04/12/16 0131  Weight: 107 kg (236 lb) 101.7 kg (224 lb 3.3 oz)    Examination:  General exam: deconditioned E ENT: mild pallor, oral mucosa moist. Respiratory system: Clear to auscultation. Respiratory effort normal. No wheezing, rales or rhonchi.  Cardiovascular system: S1 & S2 heard, RRR. No JVD, murmurs, rubs, gallops or clicks. No pedal edema. Gastrointestinal system: Abdomen is nondistended, soft and nontender. No organomegaly or masses felt. Normal bowel sounds heard. Central nervous system: Alert and oriented. No focal neurological deficits. Extremities: Symmetric 5 x 5 power. Skin:  right scapular region, stage 1 lesion, irregualar   Data Reviewed: I have personally reviewed following labs and imaging studies  CBC:  Recent Labs Lab 04/11/16 1615 04/12/16 0320 04/13/16 0543  WBC 20.7* 16.3* 11.8*  NEUTROABS 18.4*  --  9.6*  HGB 15.0 14.3 12.1*  HCT 41.7 40.3 34.7*  MCV 85.8 86.5 87.8  PLT 421* 396 0000000   Basic Metabolic Panel:  Recent Labs Lab 04/11/16 1615 04/11/16 1935 04/12/16 0320 04/13/16 0543  NA 144  --  148* 147*  K 3.4*  --  3.6 3.4*  CL 109  --  116* 119*  CO2 21*  --  16* 16*  GLUCOSE 169*  --  146* 139*  BUN 80*  --  81* 94*  CREATININE 3.82*  --   3.53* 3.55*  CALCIUM 8.8*  --  8.2* 7.9*  MG  --  2.4  --   --   PHOS  --  4.0  --   --    GFR: Estimated Creatinine Clearance: 22.5 mL/min (by C-G formula based on SCr of 3.55 mg/dL (H)). Liver Function Tests:  Recent Labs Lab 04/11/16 1615 04/12/16 0320  AST 73* 77*  ALT 87* 72*  ALKPHOS 182* 153*  BILITOT 2.1* 1.9*  PROT 7.6 6.5  ALBUMIN 3.1* 2.8*   No results for input(s): LIPASE, AMYLASE in the last 168 hours. No results for input(s): AMMONIA in the last 168 hours. Coagulation Profile: No results for input(s): INR, PROTIME in the last 168 hours. Cardiac Enzymes:  Recent Labs Lab 04/11/16 1621  CKTOTAL 60   BNP (last 3 results) No results for input(s): PROBNP in the last 8760 hours. HbA1C: No results for input(s): HGBA1C in the last 72 hours. CBG: No results for input(s): GLUCAP in the last 168 hours. Lipid Profile: No results for input(s): CHOL, HDL, LDLCALC, TRIG, CHOLHDL, LDLDIRECT in the last 72 hours. Thyroid Function Tests:  Recent Labs  04/11/16 1746  TSH 3.334  FREET4 1.32*   Anemia Panel: No results for input(s): VITAMINB12, FOLATE, FERRITIN, TIBC, IRON, RETICCTPCT in the last 72 hours. Sepsis Labs:  Recent Labs Lab 04/11/16 1630 04/11/16 1954  LATICACIDVEN 2.04* 1.73    Recent Results (from the past 240 hour(s))  Urine culture     Status: None   Collection Time: 04/11/16  5:35 PM  Result Value Ref Range Status   Specimen Description URINE, RANDOM  Final   Special Requests NONE  Final   Culture NO GROWTH Performed at [facility]   Final   Report Status 04/13/2016 FINAL  Final  Blood culture (routine x 2)     Status: None (Preliminary result)   Collection Time: 04/11/16  5:46 PM  Result Value Ref Range Status   Specimen Description BLOOD LEFT WRIST  Final   Special Requests BOTTLES DRAWN AEROBIC AND ANAEROBIC 5CC  Final   Culture   Final    NO GROWTH < 24 HOURS Performed at [facility]    Report Status PENDING   Incomplete  Blood culture (routine x 2)     Status: None (Preliminary result)   Collection Time: 04/11/16  5:56 PM  Result Value Ref Range Status   Specimen Description BLOOD LEFT ANTECUBITAL  Final   Special Requests BOTTLES DRAWN AEROBIC AND ANAEROBIC 5CC  Final   Culture   Final    NO GROWTH < 24 HOURS Performed at [facility]    Report Status PENDING  Incomplete  MRSA PCR Screening  Status: None   Collection Time: 04/12/16  1:23 AM  Result Value Ref Range Status   MRSA by PCR NEGATIVE NEGATIVE Final    Comment:        The GeneXpert MRSA Assay (FDA approved for NASAL specimens only), is one component of a comprehensive MRSA colonization surveillance program. It is not intended to diagnose MRSA infection nor to guide or monitor treatment for MRSA infections.          Radiology Studies: Dg Chest 2 View  Result Date: 04/11/2016 CLINICAL DATA:  Fatigue EXAM: CHEST  2 VIEW COMPARISON:  None. FINDINGS: Mild cardiomegaly. No confluent airspace opacities or effusions. No edema. No acute bony abnormality. Surgical changes in the proximal left humerus. IMPRESSION: Cardiomegaly.  No active disease. Electronically Signed   By: Rolm Baptise M.D.   On: 04/11/2016 17:31   Mr Thoracic Spine Wo Contrast  Result Date: 04/12/2016 CLINICAL DATA:  Generalized weakness.  Leukocytosis.  Sepsis. EXAM: MRI THORACIC SPINE WITHOUT CONTRAST TECHNIQUE: Multiplanar, multisequence MR imaging of the thoracic spine was performed. No intravenous contrast was administered. COMPARISON:  Lumbar study same day FINDINGS: Alignment:  Normal Vertebrae: Gentle kyphotic curvature throughout the thoracic region. Old minor anterior compression at T2, 20%. Cord:  No cord compression or primary cord lesion. Paraspinal and other soft tissues: Negative. Aortic atherosclerosis. Disc levels: No disc herniation. Minor chronic disc degenerative changes. No stenosis of the central canal or neural foramina. Solid  bridging osteophytes throughout the mid and lower thoracic region. IMPRESSION: No cord lesion. No cord compression. No evidence of spinal infection. Electronically Signed   By: Nelson Chimes M.D.   On: 04/12/2016 13:09   Mr Lumbar Spine Wo Contrast  Result Date: 04/12/2016 CLINICAL DATA:  Profound generalized weakness. Leukocytosis with possible sepsis. EXAM: MRI LUMBAR SPINE WITHOUT CONTRAST TECHNIQUE: Multiplanar, multisequence MR imaging of the lumbar spine was performed. No intravenous contrast was administered. COMPARISON:  None. FINDINGS: Segmentation:  5 lumbar type vertebral bodies. Alignment:  2 mm anterolisthesis L4-5. Vertebrae:  No fracture or primary bone lesion. Conus medullaris: Extends to the L1-2 level and appears normal. Paraspinal and other soft tissues: Right renal cyst. No other finding. Disc levels: L1-2: Anterior osteophytes. No disc bulge or herniation. No stenosis. L2-3: Inferior right foraminal disc protrusion. This does not appear to cause neural compression. No central canal stenosis. L3-4: Mild bulging of the disc. Mild facet hypertrophy. No compressive stenosis. L4-5: Chronic disc degeneration with endplate osteophytes and circumferential protrusion of the disc. Facet and ligamentous hypertrophy. Stenosis of both lateral recesses and neural foramina. Neural compression could occur at this level, particularly in the lateral recesses. L5-S1: Some transitional features. No disc pathology. No stenosis. IMPRESSION: L4-5: Chronic disc degeneration with endplate osteophytes and shallow protrusion of the disc. Bilateral facet and ligamentous hypertrophy. Stenosis of the lateral recesses that could possibly affect either or both L5 nerve roots. Mild to moderate foraminal narrowing as well without definite compression of the exiting L4 nerve roots. L2-3: Inferior foraminal disc protrusion on the right without apparent compressive effect upon the exiting L2 nerve root. Electronically Signed    By: Nelson Chimes M.D.   On: 04/12/2016 13:06   Dg Knee Complete 4 Views Left  Result Date: 04/11/2016 CLINICAL DATA:  Bilateral knee pain after fall several weeks ago. EXAM: LEFT KNEE - COMPLETE 4+ VIEW COMPARISON:  None. FINDINGS: No definite fracture or dislocation is noted. Moderate suprapatellar joint effusion is noted. Spurring of patella is noted. Joint spaces appear  to be intact. IMPRESSION: Moderate suprapatellar joint effusion. No definite evidence of fracture or dislocation. Electronically Signed   By: Marijo Conception, M.D.   On: 04/11/2016 13:51   Dg Knee Complete 4 Views Right  Result Date: 04/11/2016 CLINICAL DATA:  Bilateral knee pain after fall several weeks ago. EXAM: RIGHT KNEE - COMPLETE 4+ VIEW COMPARISON:  None. FINDINGS: No fracture or dislocation is noted. Moderate suprapatellar joint effusion is noted. Moderate narrowing of patellofemoral space is noted with patella spurring. Mild narrowing of medial joint space is noted. IMPRESSION: Mild degenerative joint disease is noted medially. 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 Moderate suprapatellar joint effusion is noted. No definite fracture or dislocation is noted. Electronically Signed   By: Marijo Conception, M.D.   On: 04/11/2016 13:53        Scheduled Meds: . amLODipine  10 mg Oral Daily  . aspirin EC  81 mg Oral Daily  . heparin subcutaneous  5,000 Units Subcutaneous Q8H  . losartan  100 mg Oral Daily   And  . hydrochlorothiazide  12.5 mg Oral Daily  . levothyroxine  25 mcg Oral QAC breakfast  . multivitamin-lutein  1 capsule Oral Daily  . pravastatin  80 mg Oral Daily  . sodium chloride flush  3 mL Intravenous Q12H   Continuous Infusions: . sodium chloride 75 mL/hr at 04/12/16 2121     LOS: 2 days      Tawni Millers, MD [facility] Pager [phone]  If 7PM-7AM, please contact night-coverage [email or web address] Password [facility] 04/13/2016, 9:47 AM

## 2016-04-13 NOTE — Evaluation (Signed)
Physical Therapy Evaluation Patient Details Name: Glen [name] MRN: [id number] DOB: 1940-04-12 Today's Date: 04/13/2016   History of Present Illness  76 yo male with HTN, coronary artery disease, dyslipidemia, presents with lower extremities weakness and bilateral knee pain, unable to ambulate for the last 4 days before admission. On the physical exam found hemodynamically stable, his cr was elevated as well as wbc. Received one dose of Zosyn and was admitted to the ICU for further evaluation. Neurology consulted and  MRI of the back showed chronic degeneration. (see Dr. Sarajane Jews note on 04/11/2016 as well for further explain of pt's recalling facts of fall, etc from last month)   Clinical Impression  Pt with great weakness and decreased ability with all functional mobility including bed mobility, transfer and walking. Pt very rigid, and slow to process at times, noted tremors, and decreased safety awareness and higher level reasoning with deficits. Pt to benefit from further acute PT and post acute PT in SNF in order to work on deficits noted in eval and above.      Follow Up Recommendations SNF    Equipment Recommendations  Rolling walker with 5" wheels (unsure of what pt may have at home )    Recommendations for Other Services       Precautions / Restrictions Precautions Precautions: Fall Restrictions Weight Bearing Restrictions: No      Mobility  Bed Mobility Overal bed mobility: Needs Assistance Bed Mobility: Supine to Sit;Sit to Supine     Supine to sit: +2 for physical assistance;Max assist Sit to supine: Max assist;+2 for physical assistance   General bed mobility comments: very rigid, and slower response to commands. Increased need for VC for sequencing for functional movement. Increaed labor with little movement.   Transfers Overall transfer level: Needs assistance Equipment used: Rolling walker (2 wheeled) Transfers: Sit to/from Stand Sit to Stand: +2 physical  assistance;Mod assist         General transfer comment: increaed time needed, blocked out knees to rise and remaining standing at RW the first attempt. Flexed trunk posture, and cues to push down on RW for support. Static stadning at RW first time for about 30 seconds, did respond and atempt to extend trunk and hips for more upright standing with tactile and verbal cues. Stood again at [employer] and 2nd time with slighter more ease and pt with slightly greater ability. (pt may be very fearful, would not state) . Did side step (limited lift off floor) to head of bed about 4 steps.   Ambulation/Gait Ambulation/Gait assistance:  (a few side steps to head of bed with second transfer , see notes above. )              Stairs            Wheelchair Mobility    Modified Rankin (Stroke Patients Only)       Balance Overall balance assessment: Needs assistance Sitting-balance support: Bilateral upper extremity supported Sitting balance-Leahy Scale: Fair Sitting balance - Comments: with LE attempted movement while sitting EOB, posterior lean with min A to correct.  Postural control: Posterior lean Standing balance support: Bilateral upper extremity supported Standing balance-Leahy Scale: Poor Standing balance comment: needed assistance to static stand                              Pertinent Vitals/Pain Pain Assessment:  (would grimace with active assisted movement, but would not claim to have  pain, however all momvent very guarded and resistive or stiff. )    Home Living Family/patient expects to be discharged to:: Private residence Living Arrangements: Spouse/significant other;Children (wife and son ) Available Help at Discharge: Family Type of Home: House Home Access: Stairs to enter   [employer] of Steps: states it is level entry (question accuracy of this)  Home Layout: Two level        Prior Function Level of Independence: Independent (prior to fall in  Dec, however for last few days bed bound )         Comments: Difficult to follow patient's discription of timeline, focus to story was difficult. Stated fell with first snow mid Dec when shoveling driveway, and had been good since except for last few days in bed and unable to walk.      Hand Dominance        Extremity/Trunk Assessment   Upper Extremity Assessment Upper Extremity Assessment:  (tremors noted in BUEs and some in LEs. )    Lower Extremity Assessment Lower Extremity Assessment: RLE deficits/detail;LLE deficits/detail RLE Deficits / Details: very rigid with movement, unsure if slow to process, fearful, or resistive,  and unable to produce full range even with AAROM in all LE areas. Either tremors or ataxia noted in LEs , difficult to assess. Grossly 3/5 LE MMT. knee extension and hip flexion greatly affected.  RLE Coordination: decreased fine motor;decreased gross motor LLE Deficits / Details: very rigid with movement, unsure if slow to process, fearful, or resistive, and unable to produce full range even with AAROM in all LE areas. Either tremors or ataxia noted in LEs , difficult to assess. Grossly 3/5 LE MMT. knee extension and hip flexion greatly affected LLE Coordination: decreased fine motor;decreased gross motor    Cervical / Trunk Assessment Cervical / Trunk Assessment: Kyphotic  Communication   Communication: No difficulties (at times seemed to have trouble with word fining, cognition, reasoning ? difficult to assess. )  Cognition Arousal/Alertness: Awake/alert Behavior During Therapy: WFL for tasks assessed/performed (a little flat affect, slow to process at times ) Overall Cognitive Status: Difficult to assess                 General Comments: with commnads and questions of history, at times patietn seemed to have difficulty with word retrieval and cognitive reasoning. Definietely no awareness of implications of deficits (for example I stated he will  need further therapy in order to get better to go home. He steted he had a stationary bike he could ride, however not reasoning that today he needed +2 help to even sit edge of bed and static stand)     General Comments      Exercises Other Exercises Other Exercises: began B LE ankle pumps and heel slides supine. However with AAROM , pt very rigid and limited with movement .    Assessment/Plan    PT Assessment Patient needs continued PT services  PT Problem List Decreased strength;Decreased range of motion;Decreased activity tolerance;Decreased balance;Decreased mobility;Decreased knowledge of use of DME;Decreased safety awareness          PT Treatment Interventions DME instruction;Gait training;Functional mobility training;Therapeutic exercise;Therapeutic activities;Balance training;Patient/family education    PT Goals (Current goals can be found in the Care Plan section)  Acute Rehab PT Goals Patient Stated Goal: I want to be able to move around again  PT Goal Formulation: With patient Time For Goal Achievement: 04/27/16 Potential to Achieve Goals: Good    Frequency  Min 3X/week   Barriers to discharge        Co-evaluation               End of Session Equipment Utilized During Treatment: Gait belt Activity Tolerance: Patient tolerated treatment well Patient left: in bed;with call bell/phone within reach;with bed alarm set Nurse Communication: Mobility status;Need for lift equipment (recommend lift at this time for OOB )         Time: 0925-0954 PT Time Calculation (min) (ACUTE ONLY): 29 min   Charges:   PT Evaluation $PT Eval Moderate Complexity: 1 Procedure PT Treatments $Therapeutic Exercise: 23-37 mins   PT G CodesClide Dales May 04, 2016, 11:18 AM  Clide Dales, PT Pager: [phone] 05-04-2016

## 2016-04-13 NOTE — Progress Notes (Signed)
Received call from CCMD that patient is having frequent PVC's. Patient asymptomatic. BP 15676. Pt asymptomatic. Will continue to monitor closely.

## 2016-04-14 LAB — BASIC METABOLIC PANEL
ANION GAP: 11 (ref 5–15)
BUN: 98 mg/dL — ABNORMAL HIGH (ref 6–20)
CALCIUM: 8 mg/dL — AB (ref 8.9–10.3)
CHLORIDE: 117 mmol/L — AB (ref 101–111)
CO2: 18 mmol/L — AB (ref 22–32)
Creatinine, Ser: 3.26 mg/dL — ABNORMAL HIGH (ref 0.61–1.24)
GFR calc Af Amer: 20 mL/min — ABNORMAL LOW (ref >60)
GFR calc non Af Amer: 17 mL/min — ABNORMAL LOW (ref >60)
GLUCOSE: 124 mg/dL — AB (ref 65–99)
Potassium: 3.6 mmol/L (ref 3.5–5.1)
Sodium: 146 mmol/L — ABNORMAL HIGH (ref 135–145)

## 2016-04-14 MED ORDER — POLYETHYLENE GLYCOL 3350 17 G PO PACK
17.0000 g | PACK | Freq: Every day | ORAL | Status: DC
  Administered 2016-04-14 – 2016-04-20 (×5): 17 g via ORAL
  Filled 2016-04-14 (×5): qty 1

## 2016-04-14 NOTE — NC FL2 (Signed)
[place] MEDICAID FL2 LEVEL OF CARE SCREENING TOOL     IDENTIFICATION  Patient Name: Glen [name] Birthdate: 1940-04-30 Sex: male Admission Date (Current Location): 04/11/2016  [facility] [place] [facility] [place] Number:  Herbalist and Address:  [facility],  [place], [place]      Provider Number: [id number]  Attending Physician Name and Address:  Tawni Millers, *  Relative Name and Phone Number:       Current Level of Care: Hospital Recommended Level of Care: [place] Prior Approval Number:    Date Approved/Denied:   PASRR Number: [id number]  Discharge Plan: SNF    Current Diagnoses: Patient Active Problem List   Diagnosis Date Noted  . AKI (acute kidney injury) ([place]) 04/12/2016  . Acute kidney injury ([place]) 04/11/2016  . Squamous cell skin cancer 03/06/2016  . CKD (chronic kidney disease) stage 4, GFR 15-29 ml/min ([facility]) 08/25/2015  . OBESITY 01/25/2009  . Hyperlipidemia 10/28/2006  . Essential hypertension 10/28/2006  . Coronary atherosclerosis 10/28/2006    Orientation RESPIRATION BLADDER Height & Weight     Self, Time, Situation, Place  Normal Continent Weight: 224 lb 3.3 oz (101.7 kg) Height:  6' (182.9 cm)  BEHAVIORAL SYMPTOMS/MOOD NEUROLOGICAL BOWEL NUTRITION STATUS      Continent Diet (see DC summary)  AMBULATORY STATUS COMMUNICATION OF NEEDS Skin   Extensive Assist Verbally Other (Comment) (foot scab, no dressing)                       Personal Care Assistance Level of Assistance  Bathing, Dressing Bathing Assistance: Maximum assistance   Dressing Assistance: Maximum assistance     Functional Limitations Info             SPECIAL CARE FACTORS FREQUENCY  PT (By licensed PT), OT (By licensed OT)     PT Frequency: 5/wk OT Frequency: 5/wk            Contractures      Additional Factors Info  Code Status, Allergies Code Status Info: FULL Allergies Info: Atenolol            Current Medications (04/14/2016):  This is the current hospital active medication list Current Facility-Administered Medications  Medication Dose Route Frequency Provider Last Rate Last Dose  . acetaminophen (TYLENOL) tablet 650 mg  650 mg Oral Q6H PRN Samuella Cota, MD       Or  . acetaminophen (TYLENOL) suppository 650 mg  650 mg Rectal Q6H PRN Samuella Cota, MD      . amLODipine (NORVASC) tablet 10 mg  10 mg Oral Daily Marvetta Vohs Gerome Apley, MD   10 mg at 04/14/16 1117  . aspirin EC tablet 81 mg  81 mg Oral Daily Tawni Millers, MD   81 mg at 04/14/16 1117  . heparin injection 5,000 Units  5,000 Units Subcutaneous Q8H Tiffanny Lamarche Gerome Apley, MD   5,000 Units at 04/14/16 1117  . hydrALAZINE (APRESOLINE) injection 20 mg  20 mg Intravenous Q6H PRN Lily Kocher, MD   20 mg at 04/12/16 0230  . losartan (COZAAR) tablet 100 mg  100 mg Oral Daily Tawni Millers, MD   100 mg at 04/14/16 1116   And  . hydrochlorothiazide (MICROZIDE) capsule 12.5 mg  12.5 mg Oral Daily Garnet Chatmon Gerome Apley, MD   12.5 mg at 04/14/16 1117  . levothyroxine (SYNTHROID, LEVOTHROID) tablet 25 mcg  25 mcg Oral QAC breakfast Larri Yehle Gerome Apley, MD  25 mcg at 04/14/16 0833  . multivitamin-lutein (OCUVITE-LUTEIN) capsule 1 capsule  1 capsule Oral Daily Katalyna Socarras Gerome Apley, MD   1 capsule at 04/14/16 1117  . pravastatin (PRAVACHOL) tablet 80 mg  80 mg Oral Daily Tawni Millers, MD   80 mg at 04/14/16 1116  . sodium chloride flush (NS) 0.9 % injection 3 mL  3 mL Intravenous Q12H Samuella Cota, MD   3 mL at 04/14/16 1118     Discharge Medications: Please see discharge summary for a list of discharge medications.  Relevant Imaging Results:  Relevant Lab Results:   Additional Information SS#: [id number]  Jorge Ny, LCSW

## 2016-04-14 NOTE — Progress Notes (Signed)
AChristeen Douglas made aware of long run of wide complex beats around 0600. Pt currently resting comfortably in bed. Will continue to monitor pt closely. Carnella [place] I

## 2016-04-14 NOTE — Progress Notes (Signed)
PROGRESS NOTE    Glen [name]  [id number] DOB: 09-13-1940 DOA: 04/11/2016 PCP: Eulas Post, MD    Brief Narrative:  76 yo male with HTN, coronary artery disease, dyslipidemia, presents with lower extremities weakness and bilateral knee pain, unable to ambulate for the last 4 days before admission. On the physical exam found hemodynamically stable, his cr was elevated as well as wbc. Received one dose of Zosyn and was admitted to the ICU for further evaluation. Neurology consulted and requested MRI of the back. MRI with significant disc disease. Patient able to stand with significant assistance from physical therapy. Patient with frequent PVCs.   Assessment & Plan:   Active Problems:   Acute kidney injury ([place])   AKI (acute kidney injury) ([place])    1. Lower extremity weakness. MRI reported with degenerative disc disease, continue pain control. Will need to continue physical therapy at SNF.  2. Leukocytosis. No signs of infection.  3. AKIon CKD stage IV. Continue to improve renal function, patient off IV fluids and tolerating po well. K at 3,6 and cl at 117 with Na at 146. Continue to monitor renal panel in am.   4, HTN. Hydralazone IV as need for uncontrolled blood pressure. Systolic blood pressure [id number] to 160, will continue amlodipine, losartan/ hctz combination.   5. Frequent premature ventricular complexes. Personally reviewed ekg with normal sinus rhythm, echocardiography with LVH and normal systolic function. Personally reviewed telemetry and noted bigeminy and occasional three consecutive PVC, tend to be multifocal. Old records personally reviewed, patient follows with cardiology for coronary artery disease and ischemic cardiomyopathy. Has reported bradycardia to atenolol. Will call in am primary cardiology, will possible try low dose carvedilol. Continue asa and pravastatin.   DVT prophylaxis:heparin  Code Status:full  Family Communication:No family at the  bedside  Disposition Plan:Home or snf    Consultants:   Neurology   Procedures:    Antimicrobials   Subjective: Patient feeling better, pain and stiffness improved, persistent weakness. No nausea or vomiting, no fever or chills. No dyspnea or chest pain.   Objective: Vitals:   04/13/16 1056 04/13/16 1507 04/13/16 2157 04/14/16 0600  BP:  (!) 151/85 (!) 153/91 (!) 161/101  Pulse: 92 80 79 79  Resp:  19 (!) 22 20  Temp:  97.8 F (36.6 C) 98 F (36.7 C) 98 F (36.7 C)  TempSrc:  Oral Oral Oral  SpO2:  97% 96% 97%  Weight:      Height:        Intake/Output Summary (Last 24 hours) at 04/14/16 1149 Last data filed at 04/14/16 0900  Gross per 24 hour  Intake              780 ml  Output             2200 ml  Net            -1420 ml   Filed Weights   04/11/16 1250 04/12/16 0131  Weight: 107 kg (236 lb) 101.7 kg (224 lb 3.3 oz)    Examination:  General exam: Not in pain or dyspnea E ENT: mild pallor, oral mucosa moist.  Respiratory system: Clear to auscultation. Respiratory effort normal. No wheezing, rales or rhonchi. Cardiovascular system: S1 & S2 heard, RRR. No JVD, murmurs, rubs, gallops or clicks. No pedal edema. Gastrointestinal system: Abdomen is nondistended, soft and nontender. No organomegaly or masses felt. Normal bowel sounds heard. Central nervous system: Alert and oriented. No focal neurological deficits. Extremities: Symmetric 5  x 5 power. Skin: No rashes, lesions or ulcers    Data Reviewed: I have personally reviewed following labs and imaging studies  CBC:  Recent Labs Lab 04/11/16 1615 04/12/16 0320 04/13/16 0543  WBC 20.7* 16.3* 11.8*  NEUTROABS 18.4*  --  9.6*  HGB 15.0 14.3 12.1*  HCT 41.7 40.3 34.7*  MCV 85.8 86.5 87.8  PLT 421* 396 0000000   Basic Metabolic Panel:  Recent Labs Lab 04/11/16 1615 04/11/16 1935 04/12/16 0320 04/13/16 0543 04/14/16 0549  NA 144  --  148* 147* 146*  K 3.4*  --  3.6 3.4* 3.6  CL 109  --  116*  119* 117*  CO2 21*  --  16* 16* 18*  GLUCOSE 169*  --  146* 139* 124*  BUN 80*  --  81* 94* 98*  CREATININE 3.82*  --  3.53* 3.55* 3.26*  CALCIUM 8.8*  --  8.2* 7.9* 8.0*  MG  --  2.4  --   --   --   PHOS  --  4.0  --   --   --    GFR: Estimated Creatinine Clearance: 24.5 mL/min (by C-G formula based on SCr of 3.26 mg/dL (H)). Liver Function Tests:  Recent Labs Lab 04/11/16 1615 04/12/16 0320  AST 73* 77*  ALT 87* 72*  ALKPHOS 182* 153*  BILITOT 2.1* 1.9*  PROT 7.6 6.5  ALBUMIN 3.1* 2.8*   No results for input(s): LIPASE, AMYLASE in the last 168 hours. No results for input(s): AMMONIA in the last 168 hours. Coagulation Profile: No results for input(s): INR, PROTIME in the last 168 hours. Cardiac Enzymes:  Recent Labs Lab 04/11/16 1621  CKTOTAL 60   BNP (last 3 results) No results for input(s): PROBNP in the last 8760 hours. HbA1C: No results for input(s): HGBA1C in the last 72 hours. CBG: No results for input(s): GLUCAP in the last 168 hours. Lipid Profile: No results for input(s): CHOL, HDL, LDLCALC, TRIG, CHOLHDL, LDLDIRECT in the last 72 hours. Thyroid Function Tests:  Recent Labs  04/11/16 1746  TSH 3.334  FREET4 1.32*   Anemia Panel: No results for input(s): VITAMINB12, FOLATE, FERRITIN, TIBC, IRON, RETICCTPCT in the last 72 hours. Sepsis Labs:  Recent Labs Lab 04/11/16 1630 04/11/16 1954  LATICACIDVEN 2.04* 1.73    Recent Results (from the past 240 hour(s))  Urine culture     Status: None   Collection Time: 04/11/16  5:35 PM  Result Value Ref Range Status   Specimen Description URINE, RANDOM  Final   Special Requests NONE  Final   Culture NO GROWTH Performed at [facility]   Final   Report Status 04/13/2016 FINAL  Final  Blood culture (routine x 2)     Status: None (Preliminary result)   Collection Time: 04/11/16  5:46 PM  Result Value Ref Range Status   Specimen Description BLOOD LEFT WRIST  Final   Special Requests BOTTLES  DRAWN AEROBIC AND ANAEROBIC 5CC  Final   Culture   Final    NO GROWTH 2 DAYS Performed at [facility]    Report Status PENDING  Incomplete  Blood culture (routine x 2)     Status: None (Preliminary result)   Collection Time: 04/11/16  5:56 PM  Result Value Ref Range Status   Specimen Description BLOOD LEFT ANTECUBITAL  Final   Special Requests BOTTLES DRAWN AEROBIC AND ANAEROBIC 5CC  Final   Culture   Final    NO GROWTH 2 DAYS Performed  at [facility]    Report Status PENDING  Incomplete  MRSA PCR Screening     Status: None   Collection Time: 04/12/16  1:23 AM  Result Value Ref Range Status   MRSA by PCR NEGATIVE NEGATIVE Final    Comment:        The GeneXpert MRSA Assay (FDA approved for NASAL specimens only), is one component of a comprehensive MRSA colonization surveillance program. It is not intended to diagnose MRSA infection nor to guide or monitor treatment for MRSA infections.          Radiology Studies: Mr Thoracic Spine Wo Contrast  Result Date: 04/12/2016 CLINICAL DATA:  Generalized weakness.  Leukocytosis.  Sepsis. EXAM: MRI THORACIC SPINE WITHOUT CONTRAST TECHNIQUE: Multiplanar, multisequence MR imaging of the thoracic spine was performed. No intravenous contrast was administered. COMPARISON:  Lumbar study same day FINDINGS: Alignment:  Normal Vertebrae: Gentle kyphotic curvature throughout the thoracic region. Old minor anterior compression at T2, 20%. Cord:  No cord compression or primary cord lesion. Paraspinal and other soft tissues: Negative. Aortic atherosclerosis. Disc levels: No disc herniation. Minor chronic disc degenerative changes. No stenosis of the central canal or neural foramina. Solid bridging osteophytes throughout the mid and lower thoracic region. IMPRESSION: No cord lesion. No cord compression. No evidence of spinal infection. Electronically Signed   By: Nelson Chimes M.D.   On: 04/12/2016 13:09   Mr Lumbar Spine Wo  Contrast  Result Date: 04/12/2016 CLINICAL DATA:  Profound generalized weakness. Leukocytosis with possible sepsis. EXAM: MRI LUMBAR SPINE WITHOUT CONTRAST TECHNIQUE: Multiplanar, multisequence MR imaging of the lumbar spine was performed. No intravenous contrast was administered. COMPARISON:  None. FINDINGS: Segmentation:  5 lumbar type vertebral bodies. Alignment:  2 mm anterolisthesis L4-5. Vertebrae:  No fracture or primary bone lesion. Conus medullaris: Extends to the L1-2 level and appears normal. Paraspinal and other soft tissues: Right renal cyst. No other finding. Disc levels: L1-2: Anterior osteophytes. No disc bulge or herniation. No stenosis. L2-3: Inferior right foraminal disc protrusion. This does not appear to cause neural compression. No central canal stenosis. L3-4: Mild bulging of the disc. Mild facet hypertrophy. No compressive stenosis. L4-5: Chronic disc degeneration with endplate osteophytes and circumferential protrusion of the disc. Facet and ligamentous hypertrophy. Stenosis of both lateral recesses and neural foramina. Neural compression could occur at this level, particularly in the lateral recesses. L5-S1: Some transitional features. No disc pathology. No stenosis. IMPRESSION: L4-5: Chronic disc degeneration with endplate osteophytes and shallow protrusion of the disc. Bilateral facet and ligamentous hypertrophy. Stenosis of the lateral recesses that could possibly affect either or both L5 nerve roots. Mild to moderate foraminal narrowing as well without definite compression of the exiting L4 nerve roots. L2-3: Inferior foraminal disc protrusion on the right without apparent compressive effect upon the exiting L2 nerve root. Electronically Signed   By: Nelson Chimes M.D.   On: 04/12/2016 13:06        Scheduled Meds: . amLODipine  10 mg Oral Daily  . aspirin EC  81 mg Oral Daily  . heparin subcutaneous  5,000 Units Subcutaneous Q8H  . losartan  100 mg Oral Daily   And  .  hydrochlorothiazide  12.5 mg Oral Daily  . levothyroxine  25 mcg Oral QAC breakfast  . multivitamin-lutein  1 capsule Oral Daily  . pravastatin  80 mg Oral Daily  . sodium chloride flush  3 mL Intravenous Q12H   Continuous Infusions:   LOS: 3 days  Mauricio Gerome Apley, MD [facility] Pager [phone]  If 7PM-7AM, please contact night-coverage [email or web address] Password [email or web address] 04/14/2016, 11:49 AM

## 2016-04-14 NOTE — Clinical Social Work Note (Signed)
Clinical Social Work Assessment  Patient Details  Name: Glen [name] MRN: [id number] Date of Birth: Nov 24, 1940  Date of referral:  04/14/16               Reason for consult:  Facility Placement                Permission sought to share information with:  Facility Art therapist granted to share information::  Yes, Verbal Permission Granted  Name::        Agency::  SNFs  Relationship::     Contact Information:     Housing/Transportation Living arrangements for the past 2 months:  Single Family Home Source of Information:  Patient Patient Interpreter Needed:  None Criminal Activity/Legal Involvement Pertinent to Current Situation/Hospitalization:  No - Comment as needed Significant Relationships:  Adult Children, Spouse Lives with:  Adult Children, Spouse Do you feel safe going back to the place where you live?  Yes Need for family participation in patient care:  No (Coment)  Care giving concerns:  Pt lives at home with wife and son- pt states they are there during the day and could help but became less sure as conversation continued how much aid his wife could give and how much he was capable of.   Social Worker assessment / plan:  CSW spoke with pt about PT recommendations for SNF.  Pt alert and oriented but somewhat drowsy and seemed to get overwhelmed by making a decision about anything at this time.  CSW explained difference between SNF and home health and assured pt he did not need to make final decision at this time.  Employment status:  Retired Nurse, adult PT Recommendations:  [place] / Referral to community resources:  [place]  Patient/Family's Response to care:  Initially pt was refusing SNF but after long conversation regarding his abilities at this time and MD need to DC when medically stable and not necessarily physically stable patient became agreeable to CSW initiating  SNF search and waiting to see how he progressed with PT while inpatient.  Patient/Family's Understanding of and Emotional Response to Diagnosis, Current Treatment, and Prognosis:  Pt overwhelmed by his condition at this time and hopeful he will start feeling better soon so he can return home.  Emotional Assessment Appearance:  Appears stated age Attitude/Demeanor/Rapport:    Affect (typically observed):  Appropriate, Apprehensive Orientation:  Oriented to Situation, Oriented to  Time, Oriented to Place, Oriented to Self Alcohol / Substance use:  Not Applicable Psych involvement (Current and /or in the community):  No (Comment)  Discharge Needs  Concerns to be addressed:  Care Coordination Readmission within the last 30 days:  No Current discharge risk:  Physical Impairment Barriers to Discharge:  Continued Medical Work up   Jorge Ny, LCSW 04/14/2016, 4:10 PM

## 2016-04-15 ENCOUNTER — Encounter (HOSPITAL_COMMUNITY): Payer: Self-pay | Admitting: Cardiology

## 2016-04-15 DIAGNOSIS — M5126 Other intervertebral disc displacement, lumbar region: Secondary | ICD-10-CM

## 2016-04-15 DIAGNOSIS — I471 Supraventricular tachycardia: Secondary | ICD-10-CM

## 2016-04-15 DIAGNOSIS — Z9861 Coronary angioplasty status: Secondary | ICD-10-CM

## 2016-04-15 DIAGNOSIS — N179 Acute kidney failure, unspecified: Principal | ICD-10-CM

## 2016-04-15 DIAGNOSIS — I25810 Atherosclerosis of coronary artery bypass graft(s) without angina pectoris: Secondary | ICD-10-CM

## 2016-04-15 DIAGNOSIS — I2510 Atherosclerotic heart disease of native coronary artery without angina pectoris: Secondary | ICD-10-CM

## 2016-04-15 DIAGNOSIS — I493 Ventricular premature depolarization: Secondary | ICD-10-CM

## 2016-04-15 LAB — BASIC METABOLIC PANEL
Anion gap: 15 (ref 5–15)
BUN: 106 mg/dL — ABNORMAL HIGH (ref 6–20)
CALCIUM: 8.2 mg/dL — AB (ref 8.9–10.3)
CO2: 14 mmol/L — ABNORMAL LOW (ref 22–32)
CREATININE: 3.82 mg/dL — AB (ref 0.61–1.24)
Chloride: 111 mmol/L (ref 101–111)
GFR calc non Af Amer: 14 mL/min — ABNORMAL LOW (ref >60)
GFR, EST AFRICAN AMERICAN: 17 mL/min — AB (ref >60)
Glucose, Bld: 167 mg/dL — ABNORMAL HIGH (ref 65–99)
Potassium: 3.9 mmol/L (ref 3.5–5.1)
SODIUM: 140 mmol/L (ref 135–145)

## 2016-04-15 LAB — MAGNESIUM: MAGNESIUM: 2.6 mg/dL — AB (ref 1.7–2.4)

## 2016-04-15 LAB — TROPONIN I: TROPONIN I: 0.04 ng/mL — AB (ref <0.03)

## 2016-04-15 NOTE — Consult Note (Signed)
Reason for Consult: arrhthymias    Referring Physician: Arrien   PCP:  Eulas Post, MD  Primary Cardiologist:Dr. Jimmy Picket is an 76 y.o. male.    Chief Complaint: admitted 04/11/16 for knee pain and constipation.  Weakness.   HPI:  76 year old male with hx of CAD with stent in 2004.  nuc in 01/2016 was neg for ischemia EF 47%.     Other hx of hypertension, hyperlipidemia. CKD stage 4 and now acute kidney injury.    Now admitted after fall and increased weakness, difficulty moving and could not get out of bed + urinary freq.    CK on admit 60, troponin POC 0.07 -  WBC 20.7, lactic acid 2.04- now to 1.73, Cr 3.82 up from usual 2.33 and now 3.82.   Echo this admit with EF 50-55% and now severe concentric hypertrophy.  G1DD.   Follow up troponin 0.04  Cr 3.82, K+ 3.9, mag. 2.6 Asked to see for increased PVCs.  Now no chest pain, no SOB no awareness of rapid HR.    Past Medical History:  Diagnosis Date  . Bradycardia    requiring discontinuation of b-blocker  . CAD (coronary artery disease) 2004   s/p MI, s/p stents LCX and prox LAD   . Chronic kidney disease    renal failure  . Hx of colonoscopy 05/15/2005  . Hyperlipidemia   . Hypertension   . Myocardial infarction    08/02/2002  . Obesity   . Renal insufficiency     Past Surgical History:  Procedure Laterality Date  . CARDIAC CATHETERIZATION    . CARDIAC CATHETERIZATION  2004   2 stents placed  . COLONOSCOPY    . POLYPECTOMY      Family History  Problem Relation Age of Onset  . Aneurysm Father     brain  . Hypertension Father   . Heart attack Brother   . Alcohol abuse Brother    Social History:  reports that he has never smoked. He has never used smokeless tobacco. He reports that he drinks about 0.6 oz of alcohol per week . He reports that he does not use drugs.  Allergies:  Allergies  Allergen Reactions  . Atenolol     REACTION: bradycardia    OUTPATIENT  MEDICATIONS: No current facility-administered medications on file prior to encounter.    Current Outpatient Prescriptions on File Prior to Encounter  Medication Sig Dispense Refill  . amLODipine (NORVASC) 10 MG tablet TAKE 1 TABLET EVERY DAY (Patient taking differently: TAKE 10 MG BY MOUTH EVERY DAY) 90 tablet 3  . aspirin 81 MG tablet Take 1 tab daily (Patient taking differently: Take 81 mg by mouth daily. Take 1 tab daily)    . Levothyroxine Sodium 25 MCG CAPS Take 1 capsule (25 mcg total) by mouth daily before breakfast. 90 capsule 1  . losartan-hydrochlorothiazide (HYZAAR) 100-12.5 MG tablet TAKE 1 TABLET EVERY DAY 90 tablet 3  . multivitamin-lutein (OCUVITE-LUTEIN) CAPS capsule Take 1 capsule by mouth daily.    . pravastatin (PRAVACHOL) 80 MG tablet Take 1 tablet (80 mg total) by mouth daily. 60 tablet 10   CURRENT MEDICATIONS: Scheduled Meds: . amLODipine  10 mg Oral Daily  . aspirin EC  81 mg Oral Daily  . heparin subcutaneous  5,000 Units Subcutaneous Q8H  . losartan  100 mg Oral Daily   And  . hydrochlorothiazide  12.5 mg Oral Daily  . levothyroxine  25 mcg Oral QAC breakfast  . multivitamin-lutein  1 capsule Oral Daily  . polyethylene glycol  17 g Oral Daily  . pravastatin  80 mg Oral Daily  . sodium chloride flush  3 mL Intravenous Q12H   Continuous Infusions: PRN Meds:.acetaminophen **OR** acetaminophen, hydrALAZINE   Results for orders placed or performed during the hospital encounter of 04/11/16 (from the past 48 hour(s))  Basic metabolic panel     Status: Abnormal   Collection Time: 04/14/16  5:49 AM  Result Value Ref Range   Sodium 146 (H) 135 - 145 mmol/L   Potassium 3.6 3.5 - 5.1 mmol/L   Chloride 117 (H) 101 - 111 mmol/L   CO2 18 (L) 22 - 32 mmol/L   Glucose, Bld 124 (H) 65 - 99 mg/dL   BUN 98 (H) 6 - 20 mg/dL   Creatinine, Ser 3.26 (H) 0.61 - 1.24 mg/dL   Calcium 8.0 (L) 8.9 - 10.3 mg/dL   GFR calc non Af Amer 17 (L) >60 mL/min   GFR calc Af Amer 20 (L)  >60 mL/min    Comment: (NOTE) The eGFR has been calculated using the CKD EPI equation. This calculation has not been validated in all clinical situations. eGFR's persistently <60 mL/min signify possible Chronic Kidney Disease.    Anion gap 11 5 - 15  Basic metabolic panel     Status: Abnormal   Collection Time: 04/15/16  6:23 AM  Result Value Ref Range   Sodium 140 135 - 145 mmol/L    Comment: REPEATED TO VERIFY   Potassium 3.9 3.5 - 5.1 mmol/L   Chloride 111 101 - 111 mmol/L   CO2 14 (L) 22 - 32 mmol/L   Glucose, Bld 167 (H) 65 - 99 mg/dL   BUN 106 (H) 6 - 20 mg/dL    Comment: RESULTS CONFIRMED BY MANUAL DILUTION   Creatinine, Ser 3.82 (H) 0.61 - 1.24 mg/dL   Calcium 8.2 (L) 8.9 - 10.3 mg/dL   GFR calc non Af Amer 14 (L) >60 mL/min   GFR calc Af Amer 17 (L) >60 mL/min    Comment: (NOTE) The eGFR has been calculated using the CKD EPI equation. This calculation has not been validated in all clinical situations. eGFR's persistently <60 mL/min signify possible Chronic Kidney Disease.    Anion gap 15 5 - 15  Magnesium     Status: Abnormal   Collection Time: 04/15/16  6:23 AM  Result Value Ref Range   Magnesium 2.6 (H) 1.7 - 2.4 mg/dL  Troponin I     Status: Abnormal   Collection Time: 04/15/16  6:23 AM  Result Value Ref Range   Troponin I 0.04 (HH) <0.03 ng/mL    Comment: CRITICAL RESULT CALLED TO, READ BACK BY AND VERIFIED WITH: ORAGBUNAM,I. RN AT [id number] 04/15/16 MULLINS,T    No results found. CATH 2004 presented on August 02, 2002, with acute myocardial infarction.  The patient underwent cardiac  catheterization showing occlusion of a marginal branch of the left  circumflex artery.  He had residual high-grade narrowing in the proximal LAD  and moderate disease in the distal right coronary artery.  Due to renal  insufficiency, staged intervention to the LAD was planned.   1. Successful PTCA and stent placement, proximal LAD, with reduction of 80%     narrowing to 0% with  a 3.0 x 12-mm Taxus stent, dilated to 3.5 mm.  2. Successful PTCA and stent placement in the first marginal branch of the  left circumflex artery with reduction of 100% narrowing to 10% to 20%     with placement of a 2.75 x 28-mm Taxus stent, with improvement of TIMI     grade 0 to TIMI grade 2 flow. The left circumflex artery has been  recannulated; however, there is sluggish flow distally, which puts him at a  poor prognosis for long-term patency, and thus, further interventions are  likely to be beneficial.  He has a distal RCA lesion that will be medically  managed.    GJG:MLVXBOZ:WR colds or fevers, + weight loss Skin:no rashes or ulcers. Wound on rt shoulder HEENT:no blurred vision, no congestion CV:see HPI PUL:see HPI GI:no diarrhea + constipation now resolved no melena, no indigestion GU:no hematuria, no dysuria MS:+knee joint pain, no claudication Neuro:no syncope, no lightheadedness Endo:no diabetes, no thyroid disease  Blood pressure (!) 179/102, pulse 79, temperature 98 F (36.7 C), temperature source Oral, resp. rate 20, height 6' (1.829 m), weight 224 lb 3.3 oz (101.7 kg), SpO2 99 %.  Wt Readings from Last 3 Encounters:  04/12/16 224 lb 3.3 oz (101.7 kg)  03/06/16 236 lb 9.6 oz (107.3 kg)  02/28/16 236 lb (107 kg)    PE: General:Pleasant affect, NAD Skin:Warm and dry, brisk capillary refill HEENT:normocephalic, sclera clear, mucus membranes moist Neck:supple, no JVD, no bruits  Heart:S1S2 RRR without murmur, gallup, rub or click Lungs:clear without rales, ant. no rhonchi, or wheezes KYB:TVDF, non tender, + BS, do not palpate liver spleen or masses Ext:no lower ext edema, 2+ pedal pulses, 2+ radial pulses Neuro:alert and oriented X 3, MAE, follows commands, + facial symmetry   Assessment/Plan Active Problems:   Acute kidney injury ([place])   AKI (acute kidney injury) ([place])  1. SVT this AM- short burst- pt has developed bradycardia in past with BB, could change  amlodipine to Cardizem and monitor for bradycardia.  Dr. Ellyn Hack   2. PACs and PVCs, with normal EF and K+ and Mag levels.    3. CAD with hx of stent to LAD and 1st OM but this was not expected to remain patent. Most recent nuc in Oct was without ischemia but + scar.   Cecilie Kicks  Nurse Practitioner Certified [place] Pager [phone] or after 5pm or weekends call [phone] 04/15/2016, 10:19 AM

## 2016-04-15 NOTE — Care Management Important Message (Signed)
Important Message  Patient Details  Name: REKO TRAFICANTE MRN: [id number] Date of Birth: 1940/12/16   Medicare Important Message Given:  Yes    Kerin Salen 04/15/2016, 2:54 [place] Message  Patient Details  Name: SENG POLISHCHUK MRN: [id number] Date of Birth: 02-24-1941   Medicare Important Message Given:  Yes    Kerin Salen 04/15/2016, 2:54 PM

## 2016-04-15 NOTE — Progress Notes (Signed)
Physical Therapy Treatment Patient Details Name: Glen [name] MRN: [id number] DOB: [birth year] Today's Date: 04/15/2016    History of Present Illness 76 yo male with HTN, coronary artery disease, dyslipidemia, presents with lower extremities weakness and bilateral knee pain, unable to ambulate for the last 4 days before admission. On the physical exam found hemodynamically stable, his cr was elevated as well as wbc. Received one dose of Zosyn and was admitted to the ICU for further evaluation. Neurology consulted and  MRI of the back showed chronic degeneration. (see Dr. Sarajane Jews note on 04/11/2016 as well for further explain of pt's recalling facts of fall, etc from last month)     PT Comments    Pt very slow, groggy, sluggish and delayed.  Found incont stools in bed.  Required + 2 assist to [facility].  Required + 2 assist to take a few steps.  Poor posture of flex hips and knees.  No self corrective response.  HIGH FALL RISK.    AMS with poor initiation.  Will need SNF.   Follow Up Recommendations  SNF     Equipment Recommendations       Recommendations for Other Services       Precautions / Restrictions Precautions Precautions: None Restrictions Weight Bearing Restrictions: No    Mobility  Bed Mobility Overal bed mobility: Needs Assistance Bed Mobility: Supine to Sit     Supine to sit: +2 for physical assistance;Max assist;Total assist     General bed mobility comments: very rigid, and slower response to commands. Increased need for VC for sequencing for functional movement. Increaed labor with little movement.   Transfers Overall transfer level: Needs assistance Equipment used: Rolling walker (2 wheeled) Transfers: Sit to/from [employer] transfer comment: very slow and delayed to intiate activity.  Required + 2 MAX/Total Assist to stand then had difficulty completing 1/4 pivot turn to [facility].  Reqyuied hand over hand cueing and control  to sit.    Ambulation/Gait Ambulation/Gait assistance: Max assist;Total assist;+2 physical assistance;+2 safety/equipment Ambulation Distance (Feet): 2 Feet Assistive device: Rolling walker (2 wheeled) Gait Pattern/deviations: Step-to pattern;Decreased step length - left;Decreased step length - right;Trunk flexed;Festinating;Shuffle     General Gait Details: Very rigid with flex hips and knees.  Very short steps with + 2 assist.  No coorective response.  HIGH FALL RISK   Stairs            Wheelchair Mobility    Modified Rankin (Stroke Patients Only)       Balance                                    Cognition Arousal/Alertness:  (groggy/slow to respond)                     General Comments: impaired cognition       slow to respond      delayed responses    Exercises      General Comments        Pertinent Vitals/Pain Pain Assessment: No/denies pain    Home Living                      Prior Function            PT Goals (current goals can now be found in the care plan section)  Progress towards PT goals: Progressing toward goals    Frequency    Min 3X/week      PT Plan Current plan remains appropriate    Co-evaluation             End of Session Equipment Utilized During Treatment: Gait belt Activity Tolerance: Patient limited by lethargy Patient left: in chair;with chair alarm set;with call bell/phone within reach     Time: 1002-1028 PT Time Calculation (min) (ACUTE ONLY): 26 min  Charges:  $Gait Training: 8-22 mins $Therapeutic Activity: 8-22 mins                    G Codes:      Rica Koyanagi  PTA [facility]  Acute  Rehab Pager      [phone]

## 2016-04-15 NOTE — Care Management Note (Signed)
Case Management Note  Patient Details  Name: Glen [name] MRN: [id number] Date of Birth: 1940-07-25  Subjective/Objective: 76 y/o m admitted w/AKI. From home. PT-recc SNF. CSW already following.                   Action/Plan:d/c SNF.   Expected Discharge Date:   (unknown)               Expected Discharge Plan:  Skilled Nursing Facility  In-House Referral:  Clinical Social Work  Discharge planning Services  CM Consult  Post Acute Care Choice:    Choice offered to:     DME Arranged:    DME Agency:     HH Arranged:    [place] Agency:     Status of Service:  In process, will continue to follow  If discussed at Long Length of Stay Meetings, dates discussed:    Additional Comments:  Dessa Phi, RN 04/15/2016, 1:11 PM

## 2016-04-15 NOTE — Progress Notes (Signed)
PROGRESS NOTE    Glen [name]  [id number] DOB: January 15, 1941 DOA: 04/11/2016 PCP: Eulas Post, MD    Brief Narrative:  76 yo male with HTN, coronary artery disease, dyslipidemia, presents with lower extremities weakness and bilateral knee pain, unable to ambulate for the last 4 days before admission. On the physical exam found hemodynamically stable, his cr was elevated as well as wbc. Received one dose of Zosyn and was admitted to the ICU for further evaluation. Neurology consulted and requested MRI of the back. MRI with significant disc disease. Patient able to stand with significant assistance from physical therapy. Patient with frequent PVCs.   Assessment & Plan:   Active Problems:   Acute kidney injury ([place])   AKI (acute kidney injury) ([place])   1. Lower extremity weakness. MRI reported with degenerative disc disease, continue pain control. Will need to continue physical therapy at SNF. Case manager consulted for discharge planning,   2. Leukocytosis. No signs of infection. Continue to hold on antibiotic therapy.   3. AKIon CKD stage IV. Renal function with cr at 3,8. Na at 140 and serum bicarb at 14, will follow on renal panel in am, will hold on IV fluids for the next 24 hours. Avoid hypotension or nephrotoxic medications.   4, HTN. Continue hydralazoneIV as need for uncontrolled blood pressure. Systolic blood pressure [id number], will continue amlodipine, losartan/ hctz combination.   5. Frequent premature ventricular complexes. Patient with persistent asymptomatic multifocal PVCs, will continue telemetry monitoring, consultation with cardiology requested, with recommendations to start bisoprolol. Will follow on response before discharge.     DVT prophylaxis:heparin  Code Status:full  Family Communication:No family at the bedside  Disposition Plan:Home or snf    Consultants:  Neurology   Procedures:   Antimicrobials Subjective: Feeling better,  no chest pain or palpitations, no dyspnea. No nausea or vomiting. Persistent weakness.   Objective: Vitals:   04/14/16 0600 04/14/16 1417 04/14/16 2219 04/15/16 0708  BP: (!) 161/101 (!) 142/63 (!) 160/75 (!) 186/82  Pulse: 79 65 72 79  Resp: 20 19 20 20   Temp: 98 F (36.7 C) 99.4 F (37.4 C) 98.3 F (36.8 C) 98 F (36.7 C)  TempSrc: Oral Axillary Oral Oral  SpO2: 97% 100% 99% 99%  Weight:      Height:        Intake/Output Summary (Last 24 hours) at 04/15/16 0931 Last data filed at 04/15/16 0600  Gross per 24 hour  Intake              680 ml  Output              850 ml  Net             -170 ml   Filed Weights   04/11/16 1250 04/12/16 0131  Weight: 107 kg (236 lb) 101.7 kg (224 lb 3.3 oz)    Examination:  General exam: deconditioned E ENT: mild pallor, oral mucosa moist. Respiratory system: Clear to auscultation. Respiratory effort normal. Mild decreased breath sounds at bases. Cardiovascular system: S1 & S2 heard, RRR. No JVD, murmurs, rubs, gallops or clicks. No pedal edema. Gastrointestinal system: Abdomen is nondistended, soft and nontender. No organomegaly or masses felt. Normal bowel sounds heard. Central nervous system: Alert and oriented. No focal neurological deficits. Extremities: Symmetric 5 x 5 power. Skin: No rashes, lesions or ulcers   Data Reviewed: I have personally reviewed following labs and imaging studies  CBC:  Recent Labs Lab 04/11/16 1615 04/12/16 0320  04/13/16 0543  WBC 20.7* 16.3* 11.8*  NEUTROABS 18.4*  --  9.6*  HGB 15.0 14.3 12.1*  HCT 41.7 40.3 34.7*  MCV 85.8 86.5 87.8  PLT 421* 396 0000000   Basic Metabolic Panel:  Recent Labs Lab 04/11/16 1615 04/11/16 1935 04/12/16 0320 04/13/16 0543 04/14/16 0549 04/15/16 0623  NA 144  --  148* 147* 146* 140  K 3.4*  --  3.6 3.4* 3.6 3.9  CL 109  --  116* 119* 117* 111  CO2 21*  --  16* 16* 18* 14*  GLUCOSE 169*  --  146* 139* 124* 167*  BUN 80*  --  81* 94* 98* 106*  CREATININE  3.82*  --  3.53* 3.55* 3.26* 3.82*  CALCIUM 8.8*  --  8.2* 7.9* 8.0* 8.2*  MG  --  2.4  --   --   --  2.6*  PHOS  --  4.0  --   --   --   --    GFR: Estimated Creatinine Clearance: 20.9 mL/min (by C-G formula based on SCr of 3.82 mg/dL (H)). Liver Function Tests:  Recent Labs Lab 04/11/16 1615 04/12/16 0320  AST 73* 77*  ALT 87* 72*  ALKPHOS 182* 153*  BILITOT 2.1* 1.9*  PROT 7.6 6.5  ALBUMIN 3.1* 2.8*   No results for input(s): LIPASE, AMYLASE in the last 168 hours. No results for input(s): AMMONIA in the last 168 hours. Coagulation Profile: No results for input(s): INR, PROTIME in the last 168 hours. Cardiac Enzymes:  Recent Labs Lab 04/11/16 1621 04/15/16 0623  CKTOTAL 60  --   TROPONINI  --  0.04*   BNP (last 3 results) No results for input(s): PROBNP in the last 8760 hours. HbA1C: No results for input(s): HGBA1C in the last 72 hours. CBG: No results for input(s): GLUCAP in the last 168 hours. Lipid Profile: No results for input(s): CHOL, HDL, LDLCALC, TRIG, CHOLHDL, LDLDIRECT in the last 72 hours. Thyroid Function Tests: No results for input(s): TSH, T4TOTAL, FREET4, T3FREE, THYROIDAB in the last 72 hours. Anemia Panel: No results for input(s): VITAMINB12, FOLATE, FERRITIN, TIBC, IRON, RETICCTPCT in the last 72 hours. Sepsis Labs:  Recent Labs Lab 04/11/16 1630 04/11/16 1954  LATICACIDVEN 2.04* 1.73    Recent Results (from the past 240 hour(s))  Urine culture     Status: None   Collection Time: 04/11/16  5:35 PM  Result Value Ref Range Status   Specimen Description URINE, RANDOM  Final   Special Requests NONE  Final   Culture NO GROWTH Performed at [facility]   Final   Report Status 04/13/2016 FINAL  Final  Blood culture (routine x 2)     Status: None (Preliminary result)   Collection Time: 04/11/16  5:46 PM  Result Value Ref Range Status   Specimen Description BLOOD LEFT WRIST  Final   Special Requests BOTTLES DRAWN AEROBIC AND  ANAEROBIC 5CC  Final   Culture   Final    NO GROWTH 3 DAYS Performed at [facility]    Report Status PENDING  Incomplete  Blood culture (routine x 2)     Status: None (Preliminary result)   Collection Time: 04/11/16  5:56 PM  Result Value Ref Range Status   Specimen Description BLOOD LEFT ANTECUBITAL  Final   Special Requests BOTTLES DRAWN AEROBIC AND ANAEROBIC 5CC  Final   Culture   Final    NO GROWTH 3 DAYS Performed at [facility]    Report  Status PENDING  Incomplete  MRSA PCR Screening     Status: None   Collection Time: 04/12/16  1:23 AM  Result Value Ref Range Status   MRSA by PCR NEGATIVE NEGATIVE Final    Comment:        The GeneXpert MRSA Assay (FDA approved for NASAL specimens only), is one component of a comprehensive MRSA colonization surveillance program. It is not intended to diagnose MRSA infection nor to guide or monitor treatment for MRSA infections.          Radiology Studies: No results found.      Scheduled Meds: . amLODipine  10 mg Oral Daily  . aspirin EC  81 mg Oral Daily  . heparin subcutaneous  5,000 Units Subcutaneous Q8H  . losartan  100 mg Oral Daily   And  . hydrochlorothiazide  12.5 mg Oral Daily  . levothyroxine  25 mcg Oral QAC breakfast  . multivitamin-lutein  1 capsule Oral Daily  . polyethylene glycol  17 g Oral Daily  . pravastatin  80 mg Oral Daily  . sodium chloride flush  3 mL Intravenous Q12H   Continuous Infusions:   LOS: 4 days        Areon Cocuzza Gerome Apley, MD [facility] Pager [phone]  If 7PM-7AM, please contact night-coverage [email or web address] Password [email or web address] 04/15/2016, 9:31 AM

## 2016-04-15 NOTE — Clinical Social Work Placement (Signed)
CSW spoke with patient & wife, [place] re: discharge planning. CSW reviewed SNF bed offers, wife states that her mother had been to [facility] in the past & it was closest to where they live - therefore she is accepting their bed offer. Janie at [facility] made aware. Clinicals will be submitted to [facility] for SNF request prior to discharge.    Raynaldo Opitz, [facility] Clinical Social Worker cell #: [phone]    CLINICAL SOCIAL WORK PLACEMENT  NOTE  Date:  04/15/2016  Patient Details  Name: Glen [name] MRN: [id number] Date of Birth: 12/13/1940  Clinical Social Work is seeking post-discharge placement for this patient at the [place] level of care (*CSW will initial, date and re-position this form in  chart as items are completed):  Yes   Patient/family provided with [place] Work Department's list of facilities offering this level of care within the geographic area requested by the patient (or if unable, by the patient's family).  Yes   Patient/family informed of their freedom to choose among providers that offer the needed level of care, that participate in Medicare, Medicaid or managed care program needed by the patient, have an available bed and are willing to accept the patient.  Yes   Patient/family informed of [place]'s ownership interest in [facility] and [facility], as well as of the fact that they are under no obligation to receive care at these facilities.  PASRR submitted to EDS on 04/14/16     PASRR number received on 04/14/16     Existing PASRR number confirmed on       FL2 transmitted to all facilities in geographic area requested by pt/family on 04/14/16     FL2 transmitted to all facilities within larger geographic area on       Patient informed that his/her managed care company has contracts with or will negotiate with certain facilities, including the following:        Yes    Patient/family informed of bed offers received.  Patient chooses bed at [facility]     Physician recommends and patient chooses bed at      Patient to be transferred to [facility] on  .  Patient to be transferred to facility by       Patient family notified on   of transfer.  Name of family member notified:        PHYSICIAN Please sign FL2     Additional Comment:    _______________________________________________ Standley Brooking, LCSW 04/15/2016, 2:57 PM

## 2016-04-16 ENCOUNTER — Encounter (HOSPITAL_COMMUNITY): Payer: Self-pay

## 2016-04-16 DIAGNOSIS — M6281 Muscle weakness (generalized): Secondary | ICD-10-CM

## 2016-04-16 DIAGNOSIS — N183 Chronic kidney disease, stage 3 (moderate): Secondary | ICD-10-CM

## 2016-04-16 LAB — BASIC METABOLIC PANEL
Anion gap: 13 (ref 5–15)
BUN: 124 mg/dL — ABNORMAL HIGH (ref 6–20)
CO2: 17 mmol/L — AB (ref 22–32)
Calcium: 8.1 mg/dL — ABNORMAL LOW (ref 8.9–10.3)
Chloride: 110 mmol/L (ref 101–111)
Creatinine, Ser: 4.04 mg/dL — ABNORMAL HIGH (ref 0.61–1.24)
GFR calc Af Amer: 15 mL/min — ABNORMAL LOW (ref >60)
GFR calc non Af Amer: 13 mL/min — ABNORMAL LOW (ref >60)
GLUCOSE: 140 mg/dL — AB (ref 65–99)
Potassium: 3.8 mmol/L (ref 3.5–5.1)
Sodium: 140 mmol/L (ref 135–145)

## 2016-04-16 LAB — CULTURE, BLOOD (ROUTINE X 2)
CULTURE: NO GROWTH
CULTURE: NO GROWTH

## 2016-04-16 MED ORDER — BISOPROLOL FUMARATE 5 MG PO TABS
5.0000 mg | ORAL_TABLET | Freq: Every day | ORAL | Status: DC
  Administered 2016-04-16: 5 mg via ORAL
  Filled 2016-04-16: qty 1

## 2016-04-16 MED ORDER — SODIUM CHLORIDE 0.9 % IV SOLN
INTRAVENOUS | Status: DC
  Administered 2016-04-16 – 2016-04-17 (×2): via INTRAVENOUS

## 2016-04-16 NOTE — Progress Notes (Signed)
Physical Therapy Treatment Patient Details Name: Glen [name] MRN: [id number] DOB: Jun 06, 1940 Today's Date: 04/16/2016    History of Present Illness 76 yo male with HTN, coronary artery disease, dyslipidemia, presents with lower extremities weakness and bilateral knee pain, unable to ambulate for the last 4 days before admission. On the physical exam found hemodynamically stable, his cr was elevated as well as wbc. Received one dose of Zosyn and was admitted to the ICU for further evaluation. Neurology consulted and  MRI of the back showed chronic degeneration. (see Dr. Sarajane Jews note on 04/11/2016 as well for further explain of pt's recalling facts of fall, etc from last month)     PT Comments    Assisted OOB + 2 assist to [facility].  Very rigid and slow moving.  Difficult to initiate.  Unable to functionally take steps.     Follow Up Recommendations  SNF     Equipment Recommendations  Rolling walker with 5" wheels    Recommendations for Other Services       Precautions / Restrictions Precautions Precautions: Fall Restrictions Weight Bearing Restrictions: No    Mobility  Bed Mobility Overal bed mobility: Needs Assistance Bed Mobility: Supine to Sit     Supine to sit: +2 for physical assistance;Max assist;Total assist     General bed mobility comments: very rigid, and slower response to commands. Increased need for VC for sequencing for functional movement. Increaed labor with little movement.   Transfers Overall transfer level: Needs assistance Equipment used: Rolling walker (2 wheeled)   Sit to Stand: +2 physical assistance;Mod assist;Max assist         General transfer comment: very slow and delayed to intiate activity.  Required + 2 MAX/Total Assist to stand then had difficulty completing 1/4 pivot turn to [facility].  Reqyuied hand over hand cueing and control to sit.    Ambulation/Gait Ambulation/Gait assistance: Max assist;Total assist;+2 physical assistance;+2  safety/equipment Ambulation Distance (Feet): 2 Feet Assistive device: Rolling walker (2 wheeled) Gait Pattern/deviations: Step-to pattern;Decreased step length - left;Decreased step length - right;Trunk flexed;Festinating;Shuffle Gait velocity: decreased   General Gait Details: Very rigid with flex hips and knees.  Very short steps with + 2 assist.  No coorective response.  HIGH FALL RISK   Stairs            Wheelchair Mobility    Modified Rankin (Stroke Patients Only)       Balance                                    Cognition     Overall Cognitive Status: Difficult to assess                 General Comments: impaired cognition       slow to respond      delayed responses    Exercises      General Comments        Pertinent Vitals/Pain Pain Assessment: No/denies pain    Home Living                      Prior Function            PT Goals (current goals can now be found in the care plan section)      Frequency    Min 3X/week      PT Plan Current plan remains appropriate    Co-evaluation  End of Session Equipment Utilized During Treatment: Gait belt Activity Tolerance: Patient limited by lethargy Patient left: in chair;with chair alarm set;with call bell/phone within reach     Time: 1048-1107 PT Time Calculation (min) (ACUTE ONLY): 19 min  Charges:  $Therapeutic Activity: 8-22 mins                    G Codes:      Rica Koyanagi  PTA [facility]  Acute  Rehab Pager      [phone]

## 2016-04-16 NOTE — Progress Notes (Signed)
CRITICAL VALUE ALERT  Critical value received:  Troponin 0.04  Date of notification:  04/15/16  Time of notification:  [id number]  Critical value read back:Yes.    Nurse who received alert:  Sheffield Slider  MD notified (1st page):  Dr. Cathlean Sauer  Time of first page:  [phone]  MD notified (2nd page):  Time of second page:  Responding MD:  Dr. Cathlean Sauer  Time MD responded:  Dr. Cathlean Sauer

## 2016-04-16 NOTE — Progress Notes (Addendum)
PROGRESS NOTE    Glen [name]  [id number] DOB: Jan 24, 1941 DOA: 04/11/2016 PCP: Eulas Post, MD    Brief Narrative:  76 yo male with HTN, coronary artery disease, dyslipidemia, presents with lower extremities weakness and bilateral knee pain, unable to ambulate for the last 4 days before admission. On the physical exam found hemodynamically stable, his cr was elevated as well as wbc. Received one dose of Zosyn and was admitted to the ICU for further evaluation. Neurology consulted and requested MRI of the back. MRI with significant disc disease. Patient able to stand with significant assistance from physical therapy. Patient with frequent PVCs. Started on bisoprolol and resumed IV fluids.    Assessment & Plan:   Active Problems:   Acute kidney injury ([place])   AKI (acute kidney injury) ([place])    1. Lower extremity weakness. MRI reported with degenerative disc disease, pain controlled, tolerating well physical therapy. Plan for  SNF. Case manager consulted for discharge planning,   2. Leukocytosis. No signs of infection. No indication for antibiotic therapy.   3. AKIon CKD stage IV.Renal function with cr worsening  At 4,0, will resume gentle hydration with saline and will follow on renal function in am. Avoid hypotension or nephrotoxic medications. K at 3,8 and serum bicarb at 17.   4, HTN. Continue hydralazineIV as need for uncontrolled blood pressure. Systolic blood pressure [id number] to 140, continue amlodipine, losartan/ hctz combination.   5. Frequent premature ventricular complexes. Patient with persistent asymptomatic multifocal PVCs, will continue telemetry monitoring, consultation with cardiology requested, with recommendations to start bisoprolol vs conservative care. Continue telemetry for the next 24. Will hold on b blocker for now.   6. Hypothyroid. Continue on levothyroxine.   DVT prophylaxis:heparin  Code Status:full  Family Communication:No family at  the bedside  Disposition Plan:Home or snf    Consultants:  Neurology   Procedures:   Antimicrobials  Subjective: Patient complains of thirst, no chest pain, no nausea or vomiting, tolerating po well. No fever or chills, no palpitations.   Objective: Vitals:   04/15/16 1420 04/15/16 2144 04/16/16 0629 04/16/16 0920  BP: (!) 130/91 (!) 153/80 (!) 154/94 (!) 141/84  Pulse: 80 73 76   Resp: 19 18 18    Temp: 99.1 F (37.3 C) 97.8 F (36.6 C) 98 F (36.7 C)   TempSrc: Axillary Oral Oral   SpO2: 98% 100% 98%   Weight:      Height:        Intake/Output Summary (Last 24 hours) at 04/16/16 0930 Last data filed at 04/16/16 0900  Gross per 24 hour  Intake              840 ml  Output              704 ml  Net              136 ml   Filed Weights   04/11/16 1250 04/12/16 0131  Weight: 107 kg (236 lb) 101.7 kg (224 lb 3.3 oz)    Examination:  General exam: deconditioned, not in pain or dyspnea E ENT: mild pallor, oral mucosa dry.  Respiratory system: Mild decreased breath sounds at bases. Respiratory effort normal. No wheezing, rales or rhonchi.  Cardiovascular system: S1 & S2 heard, RRR. No JVD, murmurs, rubs, gallops or clicks. No pedal edema. Gastrointestinal system: Abdomen is nondistended, soft and nontender. No organomegaly or masses felt. Normal bowel sounds heard. Central nervous system: Alert and oriented. No focal neurological deficits. Extremities:  Symmetric 5 x 5 power. Skin: No rashes, lesions or ulcers  Data Reviewed: I have personally reviewed following labs and imaging studies  CBC:  Recent Labs Lab 04/11/16 1615 04/12/16 0320 04/13/16 0543  WBC 20.7* 16.3* 11.8*  NEUTROABS 18.4*  --  9.6*  HGB 15.0 14.3 12.1*  HCT 41.7 40.3 34.7*  MCV 85.8 86.5 87.8  PLT 421* 396 0000000   Basic Metabolic Panel:  Recent Labs Lab 04/11/16 1935 04/12/16 0320 04/13/16 0543 04/14/16 0549 04/15/16 0623 04/16/16 0640  NA  --  148* 147* 146* 140 140  K  --   3.6 3.4* 3.6 3.9 3.8  CL  --  116* 119* 117* 111 110  CO2  --  16* 16* 18* 14* 17*  GLUCOSE  --  146* 139* 124* 167* 140*  BUN  --  81* 94* 98* 106* 124*  CREATININE  --  3.53* 3.55* 3.26* 3.82* 4.04*  CALCIUM  --  8.2* 7.9* 8.0* 8.2* 8.1*  MG 2.4  --   --   --  2.6*  --   PHOS 4.0  --   --   --   --   --    GFR: Estimated Creatinine Clearance: 19.8 mL/min (by C-G formula based on SCr of 4.04 mg/dL (H)). Liver Function Tests:  Recent Labs Lab 04/11/16 1615 04/12/16 0320  AST 73* 77*  ALT 87* 72*  ALKPHOS 182* 153*  BILITOT 2.1* 1.9*  PROT 7.6 6.5  ALBUMIN 3.1* 2.8*   No results for input(s): LIPASE, AMYLASE in the last 168 hours. No results for input(s): AMMONIA in the last 168 hours. Coagulation Profile: No results for input(s): INR, PROTIME in the last 168 hours. Cardiac Enzymes:  Recent Labs Lab 04/11/16 1621 04/15/16 0623  CKTOTAL 60  --   TROPONINI  --  0.04*   BNP (last 3 results) No results for input(s): PROBNP in the last 8760 hours. HbA1C: No results for input(s): HGBA1C in the last 72 hours. CBG: No results for input(s): GLUCAP in the last 168 hours. Lipid Profile: No results for input(s): CHOL, HDL, LDLCALC, TRIG, CHOLHDL, LDLDIRECT in the last 72 hours. Thyroid Function Tests: No results for input(s): TSH, T4TOTAL, FREET4, T3FREE, THYROIDAB in the last 72 hours. Anemia Panel: No results for input(s): VITAMINB12, FOLATE, FERRITIN, TIBC, IRON, RETICCTPCT in the last 72 hours. Sepsis Labs:  Recent Labs Lab 04/11/16 1630 04/11/16 1954  LATICACIDVEN 2.04* 1.73    Recent Results (from the past 240 hour(s))  Urine culture     Status: None   Collection Time: 04/11/16  5:35 PM  Result Value Ref Range Status   Specimen Description URINE, RANDOM  Final   Special Requests NONE  Final   Culture NO GROWTH Performed at [facility]   Final   Report Status 04/13/2016 FINAL  Final  Blood culture (routine x 2)     Status: None (Preliminary  result)   Collection Time: 04/11/16  5:46 PM  Result Value Ref Range Status   Specimen Description BLOOD LEFT WRIST  Final   Special Requests BOTTLES DRAWN AEROBIC AND ANAEROBIC 5CC  Final   Culture   Final    NO GROWTH 4 DAYS Performed at [facility]    Report Status PENDING  Incomplete  Blood culture (routine x 2)     Status: None (Preliminary result)   Collection Time: 04/11/16  5:56 PM  Result Value Ref Range Status   Specimen Description BLOOD LEFT ANTECUBITAL  Final  Special Requests BOTTLES DRAWN AEROBIC AND ANAEROBIC 5CC  Final   Culture   Final    NO GROWTH 4 DAYS Performed at [facility]    Report Status PENDING  Incomplete  MRSA PCR Screening     Status: None   Collection Time: 04/12/16  1:23 AM  Result Value Ref Range Status   MRSA by PCR NEGATIVE NEGATIVE Final    Comment:        The GeneXpert MRSA Assay (FDA approved for NASAL specimens only), is one component of a comprehensive MRSA colonization surveillance program. It is not intended to diagnose MRSA infection nor to guide or monitor treatment for MRSA infections.          Radiology Studies: No results found.      Scheduled Meds: . amLODipine  10 mg Oral Daily  . aspirin EC  81 mg Oral Daily  . bisoprolol  5 mg Oral Daily  . heparin subcutaneous  5,000 Units Subcutaneous Q8H  . losartan  100 mg Oral Daily   And  . hydrochlorothiazide  12.5 mg Oral Daily  . levothyroxine  25 mcg Oral QAC breakfast  . multivitamin-lutein  1 capsule Oral Daily  . polyethylene glycol  17 g Oral Daily  . pravastatin  80 mg Oral Daily  . sodium chloride flush  3 mL Intravenous Q12H   Continuous Infusions: . sodium chloride 75 mL/hr at 04/16/16 0929     LOS: 5 days       Easten Maceachern Gerome Apley, MD [facility] Pager [phone]  If 7PM-7AM, please contact night-coverage [email or web address] Password [email or web address] 04/16/2016, 9:30 AM

## 2016-04-16 NOTE — Progress Notes (Addendum)
Patient Name: Glen [name] Date of Encounter: 04/16/2016  Primary Cardiologist: Dr. [facility] Problem List     Active Problems:   Acute kidney injury [facility])   AKI (acute kidney injury) [facility])    Patient Profile     76 y/o man with known CAD-PCI & recent non-ischemic Myoview admitted with weakness & AoCKI -- had a fall with Bilatertal knee pain -> got into bed, but unable to get out of bed. NO complaints of anginal CP or dyspnea.  No sensation of irregular HR.  Echo with low normal LV Fxn & LVH.   Subjective   No chest pain and no SOB, no awareness of PVCs  Inpatient Medications    Scheduled Meds: . amLODipine  10 mg Oral Daily  . aspirin EC  81 mg Oral Daily  . heparin subcutaneous  5,000 Units Subcutaneous Q8H  . losartan  100 mg Oral Daily   And  . hydrochlorothiazide  12.5 mg Oral Daily  . levothyroxine  25 mcg Oral QAC breakfast  . multivitamin-lutein  1 capsule Oral Daily  . polyethylene glycol  17 g Oral Daily  . pravastatin  80 mg Oral Daily  . sodium chloride flush  3 mL Intravenous Q12H   Continuous Infusions:  PRN Meds: acetaminophen **OR** acetaminophen, hydrALAZINE   Vital Signs    Vitals:   04/15/16 1213 04/15/16 1420 04/15/16 2144 04/16/16 0629  BP: (!) 138/91 (!) 130/91 (!) 153/80 (!) 154/94  Pulse:  80 73 76  Resp:  19 18 18   Temp:  99.1 F (37.3 C) 97.8 F (36.6 C) 98 F (36.7 C)  TempSrc:  Axillary Oral Oral  SpO2:  98% 100% 98%  Weight:      Height:        Intake/Output Summary (Last 24 hours) at 04/16/16 0827 Last data filed at 04/16/16 [id number]  Gross per 24 hour  Intake              720 ml  Output              704 ml  Net               16 ml   Filed Weights   04/11/16 1250 04/12/16 0131  Weight: 236 lb (107 kg) 224 lb 3.3 oz (101.7 kg)    Physical Exam   GEN: Well nourished,  in no acute distress.  HEENT: normocephalic, sclera clear, mucus membranes moist.  Neck: Supple, no JVD,  or masses. Cardiac: RRR, no  murmurs, rubs, or gallops. No clubbing, cyanosis, edema.  Radials/DP/PT 2+ and equal bilaterally.  Respiratory:  Respirations regular and unlabored, clear to auscultation bilaterally, ant.  without rales, rhonchi or wheezes. GI: Abd -Soft, nontender, nondistended, BS + x 4. MS: no deformity or atrophy. Skin: warm and dry, brisk capillary refill, no obvious rash Neuro:  Alert and oriented X 3 MAE, follows commands Psych: answers questions appropriately,flat though pleasant affect.   Labs    CBC No results for input(s): WBC, NEUTROABS, HGB, HCT, MCV, PLT in the last 72 hours. Basic Metabolic Panel  Recent Labs  04/15/16 0623 04/16/16 0640  NA 140 140  K 3.9 3.8  CL 111 110  CO2 14* 17*  GLUCOSE 167* 140*  BUN 106* 124*  CREATININE 3.82* 4.04*  CALCIUM 8.2* 8.1*  MG 2.6*  --    Liver Function Tests No results for input(s): AST, ALT, ALKPHOS, BILITOT, PROT, ALBUMIN in the last 72 hours.  No results for input(s): LIPASE, AMYLASE in the last 72 hours. Cardiac Enzymes  Recent Labs  04/15/16 0623  TROPONINI 0.04*   BNP Invalid input(s): POCBNP D-Dimer No results for input(s): DDIMER in the last 72 hours. Hemoglobin A1C No results for input(s): HGBA1C in the last 72 hours. Fasting Lipid Panel No results for input(s): CHOL, HDL, LDLCALC, TRIG, CHOLHDL, LDLDIRECT in the last 72 hours. Thyroid Function Tests No results for input(s): TSH, T4TOTAL, T3FREE, THYROIDAB in the last 72 hours.  Invalid input(s): FREET3  Telemetry    SR with PVCs  - Personally Reviewed  ECG    No new EKGs - Personally Reviewed  Radiology    No results found.  Cardiac Studies   04/12/16 Echo Study Conclusions  - Left ventricle: The cavity size was normal. There was severe   concentric hypertrophy. Systolic function was normal. The   estimated ejection fraction was in the range of 50% to 55%.   Doppler parameters are consistent with abnormal left ventricular   relaxation (grade 1  diastolic dysfunction).   Assessment & Plan    Asymptomatic PAT/SVT yesterday --PVCs and PACs   Electrolytes - K & Mag ok.  Had h/o of intolerance to Atenolol in the past - but with ischemic heart disease & PVCs/PAT, BB is the best treatment option.  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  Signed, Cecilie Kicks, NP  04/16/2016, 8:27 AM  [place] Pager [phone]  After 5 or weekends [phone]   I have seen, examined and evaluated the patient this PM along with Cecilie Kicks, NP.  After reviewing all the available data and chart, we discussed the patients laboratory, study & physical findings as well as symptoms in detail. I agree with her findings, examination as well as impression recommendations as per our discussion.    Remains asymptomatic with PVCs & no further PAT/PSVT -- as per yesterday - reluctant to treat symptomatic / relatively benign events with meds that may worsen his underlying weakness -- Bisoprolol started by IM (we shall see how he does - may not tolerate). BP seems better on current Rx.  Agree with stopping Statin - at least until he has recovered.  Will arrange OP f/u with Dr. Percival Spanish.  Will Sign off for now.   Glenetta Hew, M.D., M.S. Interventional Cardiologist   Pager # [phone] Phone # [phone] [address] [place], [place] [address]

## 2016-04-17 ENCOUNTER — Inpatient Hospital Stay (HOSPITAL_COMMUNITY): Payer: Medicare HMO

## 2016-04-17 DIAGNOSIS — E039 Hypothyroidism, unspecified: Secondary | ICD-10-CM

## 2016-04-17 LAB — PROTEIN / CREATININE RATIO, URINE
CREATININE, URINE: 66.81 mg/dL
Protein Creatinine Ratio: 1.32 mg/mg{Cre} — ABNORMAL HIGH (ref 0.00–0.15)
Total Protein, Urine: 88 mg/dL

## 2016-04-17 LAB — BASIC METABOLIC PANEL
Anion gap: 13 (ref 5–15)
BUN: 127 mg/dL — AB (ref 6–20)
CHLORIDE: 108 mmol/L (ref 101–111)
CO2: 15 mmol/L — ABNORMAL LOW (ref 22–32)
Calcium: 8 mg/dL — ABNORMAL LOW (ref 8.9–10.3)
Creatinine, Ser: 4.96 mg/dL — ABNORMAL HIGH (ref 0.61–1.24)
GFR calc Af Amer: 12 mL/min — ABNORMAL LOW (ref >60)
GFR calc non Af Amer: 10 mL/min — ABNORMAL LOW (ref >60)
Glucose, Bld: 161 mg/dL — ABNORMAL HIGH (ref 65–99)
POTASSIUM: 4 mmol/L (ref 3.5–5.1)
SODIUM: 136 mmol/L (ref 135–145)

## 2016-04-17 LAB — URINALYSIS, ROUTINE W REFLEX MICROSCOPIC
BILIRUBIN URINE: NEGATIVE
GLUCOSE, UA: NEGATIVE mg/dL
HGB URINE DIPSTICK: NEGATIVE
Ketones, ur: NEGATIVE mg/dL
LEUKOCYTES UA: NEGATIVE
NITRITE: NEGATIVE
PH: 5 (ref 5.0–8.0)
Protein, ur: 100 mg/dL — AB
SPECIFIC GRAVITY, URINE: 1.011 (ref 1.005–1.030)
Squamous Epithelial / LPF: NONE SEEN

## 2016-04-17 LAB — SODIUM, URINE, RANDOM: SODIUM UR: 35 mmol/L

## 2016-04-17 LAB — CREATININE, URINE, RANDOM: Creatinine, Urine: 67.31 mg/dL

## 2016-04-17 NOTE — Progress Notes (Signed)
Care resumed for this patient at 1600. Agree with previous assessment. Bladder scan done at 1600 showing >999 ml in bladder. FC inserted per order with no complications.

## 2016-04-17 NOTE — Progress Notes (Signed)
Physical Therapy Treatment Patient Details Name: Glen [name] MRN: [id number] DOB: [birth year] Today's Date: 04/17/2016    History of Present Illness 76 yo male with HTN, coronary artery disease, dyslipidemia, presents with lower extremities weakness and bilateral knee pain, unable to ambulate for the last 4 days before admission. On the physical exam found hemodynamically stable, his cr was elevated as well as wbc. Received one dose of Zosyn and was admitted to the ICU for further evaluation. Neurology consulted and  MRI of the back showed chronic degeneration. (see Dr. Sarajane Jews note on 04/11/2016 as well for further explain of pt's recalling facts of fall, etc from last month)     PT Comments    Pt progressing slowly.  Slow to respond verbally and demonstrating difficulty initiating mvts.  + 2 assist OOB to [facility].  + 2 assist for mobility.  Used EVA walker for increased support.  Poor gait ability with rigidity and initiation.   Follow Up Recommendations  SNF     Equipment Recommendations       Recommendations for Other Services       Precautions / Restrictions Precautions Precautions: Fall Restrictions Weight Bearing Restrictions: No    Mobility  Bed Mobility Overal bed mobility: Needs Assistance Bed Mobility: Supine to Sit     Supine to sit: +2 for physical assistance;Max assist;Total assist     General bed mobility comments: very rigid, and slower response to commands. Increased need for VC for sequencing for functional movement. Increaed labor with little movement.   Transfers Overall transfer level: Needs assistance Equipment used: Rolling walker (2 wheeled) Transfers: Sit to/from [facility] Sit to Stand: +2 physical assistance;Mod assist;Max assist         General transfer comment: very slow and delayed to intiate activity.  Required + 2 MAX/Total Assist to stand then had difficulty completing 1/4 pivot turn to [facility].  Reqyuied hand over hand  cueing and control to sit.    Ambulation/Gait Ambulation/Gait assistance: Max assist;Total assist;+2 physical assistance;+2 safety/equipment Ambulation Distance (Feet): 8 Feet Assistive device: Bilateral platform walker (EVA walker) Gait Pattern/deviations: Step-to pattern;Decreased step length - right;Decreased step length - left;Shuffle Gait velocity: decreased   General Gait Details: used EVA walker for increased support.  Great difficulty initiating MVTs, festination, still requires + 2 assist.  Poor posture.     Stairs            Wheelchair Mobility    Modified Rankin (Stroke Patients Only)       Balance                                    Cognition Arousal/Alertness: Awake/alert Behavior During Therapy: WFL for tasks assessed/performed                        Exercises      General Comments        Pertinent Vitals/Pain Pain Assessment: 0-10    Home Living                      Prior Function            PT Goals (current goals can now be found in the care plan section) Progress towards PT goals: Progressing toward goals    Frequency    Min 3X/week      PT Plan Current plan remains appropriate  Co-evaluation             End of Session Equipment Utilized During Treatment: Gait belt Activity Tolerance: Patient tolerated treatment well Patient left: in chair     Time: [id number] PT Time Calculation (min) (ACUTE ONLY): 25 min  Charges:  $Gait Training: 8-22 mins $Therapeutic Activity: 8-22 mins                    G Codes:      Rica Koyanagi  PTA [facility]  Acute  Rehab Pager      [phone]

## 2016-04-17 NOTE — Progress Notes (Signed)
PROGRESS NOTE    Glen [name]  [id number] DOB: [birth year] DOA: 04/11/2016 PCP: Eulas Post, MD   Chief Complaint  Patient presents with  . Knee Pain    Bilateral  . Multiple Complaints    Brief Narrative:  HPI On 04/11/2016 by Dr. Murray Hodgkins 76 year old man PMH coronary artery disease, hypertension, hyperlipidemia, no neurologic history, who presents to the emergency department with profound generalized weakness. Initial evaluation revealed acute kidney injury, frequent PVCs and nonsustained V. tach on telemetry, elevated transaminases, leukocytosis and patient was referred for admission, per EDP with concern for possible sepsis.  History obtained from patient as well as wife at bedside. Patient and wife are difficult historians, chronicity is very difficult to determine, it is very difficult to ascertain accurate dates, however the patient fell approximately one month ago, they believe December 8, he was shoveling snow, fell forward onto his hands and ribs. He did not hit his head, his back or bottom. He was able to immediately get up and for the next 3 weeks had no difficulty ambulating. He celebrated New Year's Eve without difficulty. He daily navigates a flight of stairs without difficulty normally. As best as I can ascertain, on New Year's Day the patient found himself profoundly weak, especially in his legs, unable to get up out of bed. From January 1-January 4 he has been in bed with the exception of one episode where he fell out of bed and the fire department came to put him back in bed. He came to the hospital today because his friend insisted he do so. Patient denies any bowel or bladder problems. No sacral dysesthesia. No paresthesias or numbness throughout his body. His only complaint is profound generalized weakness and bilateral knee pain, especially with flexing, he locates the pain behind his knees. He is unable to sit up in bed without assistance. He reports that  he's been drinking very well and continuing to urinate normally. He's had no respiratory symptoms or recent respiratory illness. No fever or chills. His review of systems except as above is unrevealing. He has no neurologic history of note.  The patient denies shortness of breath but his wife has noted tachypnea. 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, Stage IV -Despite IV fluid hydration, Renal functioning worsening, currently 4.96, BUN 127 -Renal ultrasound ordered -Losartan/HCTZ discontinued -Nephrology consultation appreciated -Will order UA and urine electrolytes -monitor intake/output  Lower extremity weakness -MRI showed degenerative disc disease -PT consult is recommended SNF -Continue pain control  Leukocytosis -WBC 20.7 upon admission -Trended downward -No signs of infection  Essential hypertension -Continue amlodipine -Continue hydralazine as needed -Losartan/HCTZ discontinued  Hypothyroidism -Continue Synthroid  Frequent PVCs/PAT/PSVT -Cardiology consulted with recommendations to start bisoprolol, conservative management -Beta blocker was held as patient currently bradycardic -Echocardiogram 99991111, grade 1 diastolic dysfunction -outpatient follow up with Dr. Percival Spanish  Metabolic acidosis -Likely from renal failure -Continue to follow BMP  DVT Prophylaxis  heparin  Code Status: Full  Family Communication: None at bedside  Disposition Plan: Admitted, pending improvement in creatinine. SNF when medically stable.   Consultants Nephrology Cardiology  Procedures  Echocardiogram  Antibiotics   Anti-infectives    Start     Dose/Rate Route Frequency Ordered Stop   04/12/16 0200  piperacillin-tazobactam (ZOSYN) IVPB 3.375 g  Status:  Discontinued     3.375 g 12.5 mL/hr over 240 Minutes Intravenous Every 8 hours 04/11/16 1754 04/12/16 0003   04/11/16 1730  piperacillin-tazobactam (ZOSYN) IVPB 3.375 g  3.375 g 100 mL/hr over 30 Minutes  Intravenous STAT 04/11/16 1722 04/11/16 1839      Subjective:   [name] Patient seen and examined today.  Patient continues to feel weak. Denies any chest pain, shortness of breath, abdominal pain, nausea or vomiting, diarrhea constipation.  Objective:   Vitals:   04/16/16 0920 04/16/16 1351 04/16/16 2247 04/17/16 0433  BP: (!) 141/84 138/75 (!) 156/85 (!) 154/80  Pulse:  67 61 (!) 59  Resp:  20 18 18   Temp:  97.9 F (36.6 C) 99 F (37.2 C) 98 F (36.7 C)  TempSrc:  Oral Oral Oral  SpO2:  99% 97% 95%  Weight:      Height:        Intake/Output Summary (Last 24 hours) at 04/17/16 1431 Last data filed at 04/17/16 1405  Gross per 24 hour  Intake          2198.75 ml  Output              950 ml  Net          1248.75 ml   Filed Weights   04/11/16 1250 04/12/16 0131  Weight: 107 kg (236 lb) 101.7 kg (224 lb 3.3 oz)    Exam  General: Well developed, well nourished, NAD, appears stated age  HEENT: NCAT, mucous membranes moist.   Cardiovascular: S1 S2 auscultated, no rubs, murmurs or gallops. Regular rate and rhythm.  Respiratory: Clear to auscultation bilaterally with equal chest rise  Abdomen: Soft, nontender, nondistended, + bowel sounds  Extremities: warm dry without cyanosis clubbing or edema  Neuro: AAOx3, nonfocal  Psych: Normal affect and demeanor   Data Reviewed: I have personally reviewed following labs and imaging studies  CBC:  Recent Labs Lab 04/11/16 1615 04/12/16 0320 04/13/16 0543  WBC 20.7* 16.3* 11.8*  NEUTROABS 18.4*  --  9.6*  HGB 15.0 14.3 12.1*  HCT 41.7 40.3 34.7*  MCV 85.8 86.5 87.8  PLT 421* 396 0000000   Basic Metabolic Panel:  Recent Labs Lab 04/11/16 1935  04/13/16 0543 04/14/16 0549 04/15/16 0623 04/16/16 0640 04/17/16 0619  NA  --   < > 147* 146* 140 140 136  K  --   < > 3.4* 3.6 3.9 3.8 4.0  CL  --   < > 119* 117* 111 110 108  CO2  --   < > 16* 18* 14* 17* 15*  GLUCOSE  --   < > 139* 124* 167* 140* 161*  BUN  --    < > 94* 98* 106* 124* 127*  CREATININE  --   < > 3.55* 3.26* 3.82* 4.04* 4.96*  CALCIUM  --   < > 7.9* 8.0* 8.2* 8.1* 8.0*  MG 2.4  --   --   --  2.6*  --   --   PHOS 4.0  --   --   --   --   --   --   < > = values in this interval not displayed. GFR: Estimated Creatinine Clearance: 16.1 mL/min (by C-G formula based on SCr of 4.96 mg/dL (H)). Liver Function Tests:  Recent Labs Lab 04/11/16 1615 04/12/16 0320  AST 73* 77*  ALT 87* 72*  ALKPHOS 182* 153*  BILITOT 2.1* 1.9*  PROT 7.6 6.5  ALBUMIN 3.1* 2.8*   No results for input(s): LIPASE, AMYLASE in the last 168 hours. No results for input(s): AMMONIA in the last 168 hours. Coagulation Profile: No results for input(s): INR, PROTIME in  the last 168 hours. Cardiac Enzymes:  Recent Labs Lab 04/11/16 1621 04/15/16 0623  CKTOTAL 60  --   TROPONINI  --  0.04*   BNP (last 3 results) No results for input(s): PROBNP in the last 8760 hours. HbA1C: No results for input(s): HGBA1C in the last 72 hours. CBG: No results for input(s): GLUCAP in the last 168 hours. Lipid Profile: No results for input(s): CHOL, HDL, LDLCALC, TRIG, CHOLHDL, LDLDIRECT in the last 72 hours. Thyroid Function Tests: No results for input(s): TSH, T4TOTAL, FREET4, T3FREE, THYROIDAB in the last 72 hours. Anemia Panel: No results for input(s): VITAMINB12, FOLATE, FERRITIN, TIBC, IRON, RETICCTPCT in the last 72 hours. Urine analysis:    Component Value Date/Time   COLORURINE AMBER (A) 04/11/2016 1735   APPEARANCEUR HAZY (A) 04/11/2016 1735   LABSPEC 1.017 04/11/2016 1735   PHURINE 5.0 04/11/2016 1735   GLUCOSEU 50 (A) 04/11/2016 1735   HGBUR SMALL (A) 04/11/2016 1735   BILIRUBINUR NEGATIVE 04/11/2016 1735   KETONESUR NEGATIVE 04/11/2016 1735   PROTEINUR >=300 (A) 04/11/2016 1735   NITRITE NEGATIVE 04/11/2016 1735   LEUKOCYTESUR NEGATIVE 04/11/2016 1735   Sepsis Labs: @LABRCNTIP (procalcitonin:4,lacticidven:4)  ) Recent Results (from the past 240  hour(s))  Urine culture     Status: None   Collection Time: 04/11/16  5:35 PM  Result Value Ref Range Status   Specimen Description URINE, RANDOM  Final   Special Requests NONE  Final   Culture NO GROWTH Performed at [facility]   Final   Report Status 04/13/2016 FINAL  Final  Blood culture (routine x 2)     Status: None   Collection Time: 04/11/16  5:46 PM  Result Value Ref Range Status   Specimen Description BLOOD LEFT WRIST  Final   Special Requests BOTTLES DRAWN AEROBIC AND ANAEROBIC 5CC  Final   Culture   Final    NO GROWTH 5 DAYS Performed at [facility]    Report Status 04/16/2016 FINAL  Final  Blood culture (routine x 2)     Status: None   Collection Time: 04/11/16  5:56 PM  Result Value Ref Range Status   Specimen Description BLOOD LEFT ANTECUBITAL  Final   Special Requests BOTTLES DRAWN AEROBIC AND ANAEROBIC 5CC  Final   Culture   Final    NO GROWTH 5 DAYS Performed at [facility]    Report Status 04/16/2016 FINAL  Final  MRSA PCR Screening     Status: None   Collection Time: 04/12/16  1:23 AM  Result Value Ref Range Status   MRSA by PCR NEGATIVE NEGATIVE Final    Comment:        The GeneXpert MRSA Assay (FDA approved for NASAL specimens only), is one component of a comprehensive MRSA colonization surveillance program. It is not intended to diagnose MRSA infection nor to guide or monitor treatment for MRSA infections.       Radiology Studies: US Renal  Result Date: 04/17/2016 CLINICAL DATA:  Acute renal insufficiency, hypertension EXAM: RENAL / URINARY TRACT ULTRASOUND COMPLETE COMPARISON:  None. FINDINGS: Right Kidney: Length: 11.1 cm. There is mild fullness of the pelvocaliceal system with some cortical thinning present. There is slightly echogenic renal parenchyma suggesting chronic renal medical disease. A cyst is noted in the upper medial right kidney of 3.5 x 2.8 x 3.9 cm. Left Kidney: Length: 12.4 cm. There is slight fullness  of the pelvocaliceal system with some cortical thinning. The echogenicity of the renal parenchyma is increased  consistent with chronic renal medical disease. Two cysts are present one of 7.0 x 7.0 x 6.4 cm, and the second of 3.7 x 2.7 x 0.3 cm. No solid renal lesion is seen. Bladder: The urinary bladder is urine distended but no abnormality is evident. IMPRESSION: 1. Mild bilateral hydronephrosis and cortical thinning. Consider CT of the abdomen pelvis to assess etiology. 2. Echogenic renal parenchyma consistent with chronic renal medical disease. 3. Simple appearing bilateral renal cysts. Electronically Signed   By: Ivar Drape M.D.   On: 04/17/2016 11:59     Scheduled Meds: . amLODipine  10 mg Oral Daily  . aspirin EC  81 mg Oral Daily  . heparin subcutaneous  5,000 Units Subcutaneous Q8H  . hydrochlorothiazide  12.5 mg Oral Daily  . levothyroxine  25 mcg Oral QAC breakfast  . multivitamin-lutein  1 capsule Oral Daily  . polyethylene glycol  17 g Oral Daily  . sodium chloride flush  3 mL Intravenous Q12H   Continuous Infusions: . sodium chloride 75 mL/hr at 04/17/16 1405     LOS: 6 days   Time Spent in minutes   30 minutes  Antonina Deziel D.O. on 04/17/2016 at 2:31 PM  Between 7am to 7pm - Pager - [phone]  After 7pm go to [email or web address] - password [email or web address]  And look for the night coverage person covering for me after hours  [facility] Office  [phone]

## 2016-04-17 NOTE — Consult Note (Signed)
Renal Service Consult Note [facility]  Glen [name] 04/17/2016 [place] Requesting Physician:  Dr Ree Kida  Reason for Consult:  Acute on CRF HPI: The patient is a 76 y.o. year-old with history of CKD, MI, ,HTN, HL, CAD and obesity presenting with profound gen'd weakness.  Labs showed AKI/ CKD, frequent PVC"s and NSVT on telemetry, also ^LFT's and ^WBC.  Pt admitted for poss sepsis on 04/11/16.  Had sig LE weakness bilat so neuro consulted for possible GBS. Seen by neuro who felt he had immobility due to severe bilat knee pain, and also noted impaired proprioception in the toes and chronic LE neuropathy.  MRI and rheum consult w B12 was ordered.     While here the creat which was 2.3 last Nov, was 3.8 on admission and today is up to 4.96.  Has been getting IVF"s around the clock the last several days.  Losartan and HCTZ were held today.  No hypotension, no nsaids and no IV contrast given.    MRI was done showing DDD, PT was consulted.  WBC up to 20k, no clinical signs of infection, follow per primary MD.    Renal US done today showing bilat hydronephrosis, mild.    Patient denies any voiding issues, no hx prostate / bladder disease. +hx cardiac stents, no hx cancer/ CVA.  +HTN for years. No DM.  Is patient of Dr Leanne Chang.    Grew up in the [place], was 8 credits short of a degree from the [employer].  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  Married with one son, he lives with his wife and their son in [place] now.  No etoh or tobacco.    Inpt meds > norvasc, asa, hep SQ, losartan/ HCT (dc'd today), T4, statin, IV NS 75/hr,    Date    Creat   eGFR  CKD May 2017  2.29  29  Stage IV Nov 2017  2.33  29  Stage IV Apr 11 2016  3.82 Jan 6   3.55 Jan 8   3.82 Jan 9   4.04  15  Stage V Apr 17, 2016  4.96  12  Stage V     UA 1/4 > no bact, 50 glu, >300 prot, 0-5 rbc/ wbc Renal US > 1/10 >> 11.1/ 12.4 cm kidneys, bilat mild fullness of pelvocaliceal  collecting system with some cortical thinning present.  Slightly echogenic parenchyma bilat. > conclusion is mild bilat hydronephrosis and cortical thinning.  Consider CT abd to assess etiology.   Home medications > norvasc, asa, T4, Hyzaar, MVI, statin ECHO >  LVEF 50-55%, severe conc LVH, G1DD CXR 04/11/16 > no active disease    ROS  denies CP  no joint pain   no HA  no blurry vision  no rash  no diarrhea  no nausea/ vomiting  no dysuria  no difficulty voiding  no change in urine color    Past Medical History  Past Medical History:  Diagnosis Date  . Bradycardia    requiring discontinuation of b-blocker  . CAD S/P percutaneous coronary angioplasty 2004   s/p MI, s/p stents LCX and prox LAD; Myoview 01/2016: non-ischemic.  EF ~47%. Lateral, inferolateral Infarct c/w prior Cx MI  . Chronic kidney disease    renal failure  . Hx of colonoscopy 05/15/2005  . Hyperlipidemia   . Hypertension   . Myocardial infarction    08/02/2002  . Obesity   . Renal insufficiency    Past  Surgical History  Past Surgical History:  Procedure Laterality Date  . CARDIAC CATHETERIZATION    . CARDIAC CATHETERIZATION  2004   2 stents placed  . COLONOSCOPY    . POLYPECTOMY     Family History  Family History  Problem Relation Age of Onset  . Aneurysm Father     brain  . Hypertension Father   . Heart attack Brother   . Alcohol abuse Brother    Social History  reports that he has never smoked. He has never used smokeless tobacco. He reports that he drinks about 0.6 oz of alcohol per week . He reports that he does not use drugs. Allergies  Allergies  Allergen Reactions  . Atenolol     REACTION: bradycardia   Home medications Prior to Admission medications   Medication Sig Start Date End Date Taking? Authorizing Provider  amLODipine (NORVASC) 10 MG tablet TAKE 1 TABLET EVERY DAY Patient taking differently: TAKE 10 MG BY MOUTH EVERY DAY 08/07/15  Yes Eulas Post, MD  aspirin 81 MG  tablet Take 1 tab daily Patient taking differently: Take 81 mg by mouth daily. Take 1 tab daily 08/03/10  Yes Jolaine Artist, MD  Levothyroxine Sodium 25 MCG CAPS Take 1 capsule (25 mcg total) by mouth daily before breakfast. 02/22/16  Yes Eulas Post, MD  losartan-hydrochlorothiazide (HYZAAR) 100-12.5 MG tablet TAKE 1 TABLET EVERY DAY 11/08/15  Yes Eulas Post, MD  multivitamin-lutein (OCUVITE-LUTEIN) CAPS capsule Take 1 capsule by mouth daily.   Yes Historical Provider, MD  pravastatin (PRAVACHOL) 80 MG tablet Take 1 tablet (80 mg total) by mouth daily. 01/31/16  Yes Minus Breeding, MD   Liver Function Tests  Recent Labs Lab 04/11/16 1615 04/12/16 0320  AST 73* 77*  ALT 87* 72*  ALKPHOS 182* 153*  BILITOT 2.1* 1.9*  PROT 7.6 6.5  ALBUMIN 3.1* 2.8*   No results for input(s): LIPASE, AMYLASE in the last 168 hours. CBC  Recent Labs Lab 04/11/16 1615 04/12/16 0320 04/13/16 0543  WBC 20.7* 16.3* 11.8*  NEUTROABS 18.4*  --  9.6*  HGB 15.0 14.3 12.1*  HCT 41.7 40.3 34.7*  MCV 85.8 86.5 87.8  PLT 421* 396 017   Basic Metabolic Panel  Recent Labs Lab 04/11/16 1615 04/11/16 1935 04/12/16 0320 04/13/16 0543 04/14/16 0549 04/15/16 0623 04/16/16 0640 04/17/16 0619  NA 144  --  148* 147* 146* 140 140 136  K 3.4*  --  3.6 3.4* 3.6 3.9 3.8 4.0  CL 109  --  116* 119* 117* 111 110 108  CO2 21*  --  16* 16* 18* 14* 17* 15*  GLUCOSE 169*  --  146* 139* 124* 167* 140* 161*  BUN 80*  --  81* 94* 98* 106* 124* 127*  CREATININE 3.82*  --  3.53* 3.55* 3.26* 3.82* 4.04* 4.96*  CALCIUM 8.8*  --  8.2* 7.9* 8.0* 8.2* 8.1* 8.0*  PHOS  --  4.0  --   --   --   --   --   --    Iron/TIBC/Ferritin/ %Sat No results found for: IRON, TIBC, FERRITIN, IRONPCTSAT  Vitals:   04/16/16 0920 04/16/16 1351 04/16/16 2247 04/17/16 0433  BP: (!) 141/84 138/75 (!) 156/85 (!) 154/80  Pulse:  67 61 (!) 59  Resp:  '20 18 18  '$ Temp:  97.9 F (36.6 C) 99 F (37.2 C) 98 F (36.7 C)   TempSrc:  Oral Oral Oral  SpO2:  99% 97% 95%  Weight:  Height:       Exam Gen older adult male, not in distress, appears ill No rash, cyanosis or gangrene Sclera anicteric, throat clear and dry  No jvd or bruits Chest some insp coarse rales R base, L clear RRR no MRG Abd soft ntnd no mass or ascites +bs obese, +question SP fullness to palp/ percussion GU normal male w condom cath MS no joint effusions or deformity Ext no LE or UE edema / no wounds or ulcers Neuro is alert, Ox 3 , moves hands and feet w/o difficulty, good sensation bilat feet / legs    UA 1/4 > no bact, 50 glu, >300 prot, 0-5 rbc/ wbc Renal US > 1/10 >> 11.1/ 12.4 cm kidneys, bilat mild fullness of pelvocaliceal collecting system with some cortical thinning present.  Slightly echogenic parenchyma bilat. > conclusion is mild bilat hydronephrosis and cortical thinning.  Consider CT abd to assess etiology.   Home medications > norvasc, asa, T4, Hyzaar, MVI, statin ECHO >  LVEF 50-55%, severe conc LVH, G1DD CXR 04/11/16 > no active disease  Date    Creat   eGFR  CKD May 2017  2.29  29  Stage IV Nov 2017  2.33  29  Stage IV Apr 11 2016  3.82 Jan 6   3.55 Jan 8   3.82 Jan 9   4.04  15  Stage V Apr 17, 2016  4.96  12  Stage V   Assessment: 1  Acute on CKD 3/4 w/ bilat hydro on [place]- has large distended bladder on exam and bladder scan confirms > 999 cc.  Plan is place foley, dc IVF's.  If can't get foley in will need urology help.  Ordered CT scan for cause of hydro.  Quantitate proteinuria.  2  HTN - on norvasc only, vol is ok 3  LVH 4  CAD hx MI/ stents 5  HL  Plan - as above.    Kelly Splinter MD [employer] pager [phone]   04/17/2016, 2:22 PM

## 2016-04-18 ENCOUNTER — Inpatient Hospital Stay (HOSPITAL_COMMUNITY): Payer: Medicare HMO

## 2016-04-18 DIAGNOSIS — R339 Retention of urine, unspecified: Secondary | ICD-10-CM

## 2016-04-18 DIAGNOSIS — N1330 Unspecified hydronephrosis: Secondary | ICD-10-CM

## 2016-04-18 LAB — BASIC METABOLIC PANEL
ANION GAP: 13 (ref 5–15)
BUN: 142 mg/dL — ABNORMAL HIGH (ref 6–20)
CHLORIDE: 108 mmol/L (ref 101–111)
CO2: 14 mmol/L — ABNORMAL LOW (ref 22–32)
Calcium: 7.9 mg/dL — ABNORMAL LOW (ref 8.9–10.3)
Creatinine, Ser: 4.95 mg/dL — ABNORMAL HIGH (ref 0.61–1.24)
GFR calc non Af Amer: 10 mL/min — ABNORMAL LOW (ref >60)
GFR, EST AFRICAN AMERICAN: 12 mL/min — AB (ref >60)
Glucose, Bld: 124 mg/dL — ABNORMAL HIGH (ref 65–99)
POTASSIUM: 3.7 mmol/L (ref 3.5–5.1)
Sodium: 135 mmol/L (ref 135–145)

## 2016-04-18 LAB — CBC
HEMATOCRIT: 33.6 % — AB (ref 39.0–52.0)
HEMOGLOBIN: 11.6 g/dL — AB (ref 13.0–17.0)
MCH: 30.1 pg (ref 26.0–34.0)
MCHC: 34.5 g/dL (ref 30.0–36.0)
MCV: 87.3 fL (ref 78.0–100.0)
Platelets: 457 10*3/uL — ABNORMAL HIGH (ref 150–400)
RBC: 3.85 MIL/uL — ABNORMAL LOW (ref 4.22–5.81)
RDW: 14 % (ref 11.5–15.5)
WBC: 13.2 10*3/uL — AB (ref 4.0–10.5)

## 2016-04-18 MED ORDER — SODIUM CHLORIDE 0.45 % IV SOLN
INTRAVENOUS | Status: DC
Start: 2016-04-18 — End: 2016-04-20
  Administered 2016-04-18 – 2016-04-20 (×8): via INTRAVENOUS

## 2016-04-18 NOTE — Consult Note (Signed)
[facility] Nurse wound consult note Reason for Consult: Consult requested for right posterior shoulder.  Pt states he is followed by a dermatologist prior to admission and had a biopsy performed to this location which was begnin.  He is due for a follow-up apt in Feb.  Pt states he did not use any topical treatment or dressing to the site prior to admission, but it appears that the pulling patient up in bed and over to stretchers for various procedures has re-opened the affected area related to shear. Wound type: Full thickness wound Pressure Injury POA: This was present on admission but is NOT a pressure injury. Measurement: 4X5X.1cm Wound bed: 100% beefy red, small amt yellow drainage, no odor Dressing procedure/placement/frequency: Foam dressing to protect and promote healing.  Discussed plan of care with patient and family member at the bedside.  They deny further questions and plan to follow-up with the dermatologist after discharge. Please re-consult if further assistance is needed.  Thank-you,  Julien Girt MSN, [place], [place], [place], [place]

## 2016-04-18 NOTE — Progress Notes (Signed)
[facility] Progress Note   Subjective: no c/o's. Creat stabel 4.96, foley placed yest.   Vitals:   04/17/16 2315 04/18/16 0115 04/18/16 0500 04/18/16 0522  BP:    (!) 152/88  Pulse:    70  Resp:    (!) 25  Temp: 99 F (37.2 C) 97.5 F (36.4 C)  98.4 F (36.9 C)  TempSrc: Axillary Oral  Oral  SpO2:    97%  Weight:   103.1 kg (227 lb 4.7 oz) 103.1 kg (227 lb 4.8 oz)  Height:        Inpatient medications: . amLODipine  10 mg Oral Daily  . aspirin EC  81 mg Oral Daily  . heparin subcutaneous  5,000 Units Subcutaneous Q8H  . levothyroxine  25 mcg Oral QAC breakfast  . multivitamin-lutein  1 capsule Oral Daily  . polyethylene glycol  17 g Oral Daily  . sodium chloride flush  3 mL Intravenous Q12H   . sodium chloride     acetaminophen **OR** acetaminophen, hydrALAZINE  Exam: Gen older adult male, not in distress, appears ill No rash, cyanosis or gangrene Sclera anicteric, throat clear and dry  No jvd or bruits Chest some insp coarse rales R base, L clear RRR no MRG Abd soft ntnd no mass or ascites +bs obese GU normal male w foley cath draining clear yellowish urine MS no joint effusions or deformity Ext no LE or UE edema / no wounds or ulcers Neuro is alert, Ox 3 , moves hands and feet w/o difficulty, good sensation bilat feet / legs    UA 1/4 > no bact, 50 glu, >300 prot, 0-5 rbc/ wbc Renal US > 1/10 >> 11.1/ 12.4 cm kidneys, mild bilat hydronephrosis and cortical thinning.  Consider CT abd to assess etiology.   Home medications > norvasc, asa, T4, Hyzaar, MVI, statin ECHO >  LVEF 50-55%, severe conc LVH, G1DD CXR 04/11/16 > no active disease UPC ratio - 1.32 Repeat UA 1/10 > 100 prot, 0-5 rbc/ wbc  Date                            Creat               eGFR               CKD May 2017                    2.29                 29                    Stage IV Nov 2017                     2.33                 29                    Stage IV Apr 11 2016                   3.82 Jan 6                           3.55 Jan 8  3.82 Jan 9                           4.04                 15                    Stage V Apr 17, 2016               4.96                 12                    Stage V   Assessment: 1  Acute on CKD 3/4 w/ bilat hydro on [place]- creat stable after foley placement.  BUN up, post-obstructive and/or osmotic diuresis Donah Driver).  Stable clinically, no indication for dialysis yet.  2  HTN - on norvasc only, vol is ok 3  LVH 4  CAD hx MI/ stents 5  HL   Plan - cont w foley, add IVF's for postobstructive/ osm diuresis. Get CT scan abd w/o contrast.    [place] pager [phone]   04/18/2016, 7:27 AM    Recent Labs Lab 04/11/16 1935  04/16/16 0640 04/17/16 0619 04/18/16 0552  NA  --   < > 140 136 135  K  --   < > 3.8 4.0 3.7  CL  --   < > 110 108 108  CO2  --   < > 17* 15* 14*  GLUCOSE  --   < > 140* 161* 124*  BUN  --   < > 124* 127* 142*  CREATININE  --   < > 4.04* 4.96* 4.95*  CALCIUM  --   < > 8.1* 8.0* 7.9*  PHOS 4.0  --   --   --   --   < > = values in this interval not displayed.  Recent Labs Lab 04/11/16 1615 04/12/16 0320  AST 73* 77*  ALT 87* 72*  ALKPHOS 182* 153*  BILITOT 2.1* 1.9*  PROT 7.6 6.5  ALBUMIN 3.1* 2.8*    Recent Labs Lab 04/11/16 1615 04/12/16 0320 04/13/16 0543 04/18/16 0552  WBC 20.7* 16.3* 11.8* 13.2*  NEUTROABS 18.4*  --  9.6*  --   HGB 15.0 14.3 12.1* 11.6*  HCT 41.7 40.3 34.7* 33.6*  MCV 85.8 86.5 87.8 87.3  PLT 421* 396 349 457*   Iron/TIBC/Ferritin/ %Sat No results found for: IRON, TIBC, FERRITIN, IRONPCTSAT

## 2016-04-18 NOTE — Progress Notes (Signed)
PROGRESS NOTE    Glen [name]  [id number] DOB: [birth year] DOA: 04/11/2016 PCP: Eulas Post, MD   Chief Complaint  [name] presents with  . Knee Pain    Bilateral  . Multiple Complaints    Brief Narrative:  HPI On 04/11/2016 by Dr. Murray Hodgkins 76 year old man PMH coronary artery disease, hypertension, hyperlipidemia, no neurologic history, who presents to the emergency department with profound generalized weakness. Initial evaluation revealed acute kidney injury, frequent PVCs and nonsustained V. tach on telemetry, elevated transaminases, leukocytosis and [name] was referred for admission, per EDP with concern for possible sepsis.  History obtained from [name] as well as wife at bedside. [name] and wife are difficult historians, chronicity is very difficult to determine, it is very difficult to ascertain accurate dates, however the [name] fell approximately one month ago, they believe December 8, he was shoveling snow, fell forward onto his hands and ribs. He did not hit his head, his back or bottom. He was able to immediately get up and for the next 3 weeks had no difficulty ambulating. He celebrated New Year's Eve without difficulty. He daily navigates a flight of stairs without difficulty normally. As best as I can ascertain, on New Year's Day the [name] found himself profoundly weak, especially in his legs, unable to get up out of bed. From January 1-January 4 he has been in bed with the exception of one episode where he fell out of bed and the fire department came to put him back in bed. He came to the hospital today because his friend insisted he do so. [name] denies any bowel or bladder problems. No sacral dysesthesia. No paresthesias or numbness throughout his body. His only complaint is profound generalized weakness and bilateral knee pain, especially with flexing, he locates the pain behind his knees. He is unable to sit up in bed without assistance. He reports that  he's been drinking very well and continuing to urinate normally. He's had no respiratory symptoms or recent respiratory illness. No fever or chills. His review of systems except as above is unrevealing. He has no neurologic history of note.  The [name] denies shortness of breath but his wife has noted tachypnea. 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, Stage IV/ Bilateral hydronephrosis -Despite IV fluid hydration, Renal functioning worsening, peaked to 4.96, BUN 127 -Creatinine 4.95 -Losartan/HCTZ discontinued -Nephrology consultation appreciated -Renal US showed mild bilateral hydronephrosis -monitor intake/output -Foley catheter placed for urinary retention, bladder scan on 1/10 showed 999cc  -CT abd/pelvis: Mild hydronephrosis resolved since decompression of urinary bladder  Lower extremity weakness -MRI showed degenerative disc disease -PT consult is recommended SNF -Continue pain control  Leukocytosis -WBC 20.7 upon admission -Trended downward -No signs of infection  Essential hypertension -Continue amlodipine -Continue hydralazine as needed -Losartan/HCTZ discontinued  Hypothyroidism -Continue Synthroid  Frequent PVCs/PAT/PSVT -Cardiology consulted with recommendations to start bisoprolol, conservative management -Beta blocker was held as [name] currently bradycardic -Echocardiogram 99991111, grade 1 diastolic dysfunction -outpatient follow up with Dr. Percival Spanish  Metabolic acidosis -Likely from renal failure -Continue to follow BMP  Pulmonary nodules -Seen on CT abd/pelvis: multiple tiny nodules, needs repeat CT scan in 12 months if high risk, if low risk- no follow up needed  Posterior shoulder wound -wound care consulted -[name] recently had a biopsy done by dermatology  DVT Prophylaxis  heparin  Code Status: Full  Family Communication: None at bedside  Disposition Plan: Admitted, pending improvement in creatinine. SNF when medically stable.    Consultants  Nephrology Cardiology  Procedures  Echocardiogram Renal US  Antibiotics   Anti-infectives    Start     Dose/Rate Route Frequency Ordered Stop   04/12/16 0200  piperacillin-tazobactam (ZOSYN) IVPB 3.375 g  Status:  Discontinued     3.375 g 12.5 mL/hr over 240 Minutes Intravenous Every 8 hours 04/11/16 1754 04/12/16 0003   04/11/16 1730  piperacillin-tazobactam (ZOSYN) IVPB 3.375 g     3.375 g 100 mL/hr over 30 Minutes Intravenous STAT 04/11/16 1722 04/11/16 1839      Subjective:   Glen [name] seen and examined today.  [name] continues to feel weak and complains of back pain. Denies any chest pain, shortness of breath, abdominal pain, nausea or vomiting, diarrhea constipation.  Objective:   Vitals:   04/17/16 2315 04/18/16 0115 04/18/16 0500 04/18/16 0522  BP:    (!) 152/88  Pulse:    70  Resp:    (!) 25  Temp: 99 F (37.2 C) 97.5 F (36.4 C)  98.4 F (36.9 C)  TempSrc: Axillary Oral  Oral  SpO2:    97%  Weight:   103.1 kg (227 lb 4.7 oz) 103.1 kg (227 lb 4.8 oz)  Height:        Intake/Output Summary (Last 24 hours) at 04/18/16 1305 Last data filed at 04/18/16 1247  Gross per 24 hour  Intake             2160 ml  Output             4775 ml  Net            -2615 ml   Filed Weights   04/12/16 0131 04/18/16 0500 04/18/16 0522  Weight: 101.7 kg (224 lb 3.3 oz) 103.1 kg (227 lb 4.7 oz) 103.1 kg (227 lb 4.8 oz)    Exam  General: Well developed, well nourished, NAD, appears stated age  HEENT: NCAT, mucous membranes moist.   Cardiovascular: S1 S2 auscultated, RRR, no murmurs  Respiratory: Coarse breath sounds.   Abdomen: Soft, nontender, nondistended, + bowel sounds  Extremities: warm dry without cyanosis clubbing or edema  Neuro: AAOx3, nonfocal  Skin: wound noted on posterior right shoulder, with yellow drainage  Psych: Normal affect and demeanor   Data Reviewed: I have personally reviewed following labs and imaging  studies  CBC:  Recent Labs Lab 04/11/16 1615 04/12/16 0320 04/13/16 0543 04/18/16 0552  WBC 20.7* 16.3* 11.8* 13.2*  NEUTROABS 18.4*  --  9.6*  --   HGB 15.0 14.3 12.1* 11.6*  HCT 41.7 40.3 34.7* 33.6*  MCV 85.8 86.5 87.8 87.3  PLT 421* 396 349 [id number]*   Basic Metabolic Panel:  Recent Labs Lab 04/11/16 1935  04/14/16 0549 04/15/16 0623 04/16/16 0640 04/17/16 0619 04/18/16 0552  NA  --   < > 146* 140 140 136 135  K  --   < > 3.6 3.9 3.8 4.0 3.7  CL  --   < > 117* 111 110 108 108  CO2  --   < > 18* 14* 17* 15* 14*  GLUCOSE  --   < > 124* 167* 140* 161* 124*  BUN  --   < > 98* 106* 124* 127* 142*  CREATININE  --   < > 3.26* 3.82* 4.04* 4.96* 4.95*  CALCIUM  --   < > 8.0* 8.2* 8.1* 8.0* 7.9*  MG 2.4  --   --  2.6*  --   --   --   PHOS 4.0  --   --   --   --   --   --   < > =  values in this interval not displayed. GFR: Estimated Creatinine Clearance: 16.3 mL/min (by C-G formula based on SCr of 4.95 mg/dL (H)). Liver Function Tests:  Recent Labs Lab 04/11/16 1615 04/12/16 0320  AST 73* 77*  ALT 87* 72*  ALKPHOS 182* 153*  BILITOT 2.1* 1.9*  PROT 7.6 6.5  ALBUMIN 3.1* 2.8*   No results for input(s): LIPASE, AMYLASE in the last 168 hours. No results for input(s): AMMONIA in the last 168 hours. Coagulation Profile: No results for input(s): INR, PROTIME in the last 168 hours. Cardiac Enzymes:  Recent Labs Lab 04/11/16 1621 04/15/16 0623  CKTOTAL 60  --   TROPONINI  --  0.04*   BNP (last 3 results) No results for input(s): PROBNP in the last 8760 hours. HbA1C: No results for input(s): HGBA1C in the last 72 hours. CBG: No results for input(s): GLUCAP in the last 168 hours. Lipid Profile: No results for input(s): CHOL, HDL, LDLCALC, TRIG, CHOLHDL, LDLDIRECT in the last 72 hours. Thyroid Function Tests: No results for input(s): TSH, T4TOTAL, FREET4, T3FREE, THYROIDAB in the last 72 hours. Anemia Panel: No results for input(s): VITAMINB12, FOLATE, FERRITIN,  TIBC, IRON, RETICCTPCT in the last 72 hours. Urine analysis:    Component Value Date/Time   COLORURINE YELLOW 04/17/2016 1632   APPEARANCEUR HAZY (A) 04/17/2016 1632   LABSPEC 1.011 04/17/2016 1632   PHURINE 5.0 04/17/2016 1632   GLUCOSEU NEGATIVE 04/17/2016 1632   HGBUR NEGATIVE 04/17/2016 [place] 04/17/2016 1632   KETONESUR NEGATIVE 04/17/2016 1632   PROTEINUR 100 (A) 04/17/2016 1632   NITRITE NEGATIVE 04/17/2016 1632   LEUKOCYTESUR NEGATIVE 04/17/2016 1632   Sepsis Labs: @LABRCNTIP (procalcitonin:4,lacticidven:4)  ) Recent Results (from the past 240 hour(s))  Urine culture     Status: None   Collection Time: 04/11/16  5:35 PM  Result Value Ref Range Status   Specimen Description URINE, RANDOM  Final   Special Requests NONE  Final   Culture NO GROWTH Performed at [facility]   Final   Report Status 04/13/2016 FINAL  Final  Blood culture (routine x 2)     Status: None   Collection Time: 04/11/16  5:46 PM  Result Value Ref Range Status   Specimen Description BLOOD LEFT WRIST  Final   Special Requests BOTTLES DRAWN AEROBIC AND ANAEROBIC 5CC  Final   Culture   Final    NO GROWTH 5 DAYS Performed at [facility]    Report Status 04/16/2016 FINAL  Final  Blood culture (routine x 2)     Status: None   Collection Time: 04/11/16  5:56 PM  Result Value Ref Range Status   Specimen Description BLOOD LEFT ANTECUBITAL  Final   Special Requests BOTTLES DRAWN AEROBIC AND ANAEROBIC 5CC  Final   Culture   Final    NO GROWTH 5 DAYS Performed at [facility]    Report Status 04/16/2016 FINAL  Final  MRSA PCR Screening     Status: None   Collection Time: 04/12/16  1:23 AM  Result Value Ref Range Status   MRSA by PCR NEGATIVE NEGATIVE Final    Comment:        The GeneXpert MRSA Assay (FDA approved for NASAL specimens only), is one component of a comprehensive MRSA colonization surveillance program. It is not intended to diagnose  MRSA infection nor to guide or monitor treatment for MRSA infections.       Radiology Studies: Ct Abdomen Pelvis Wo Contrast  Result Date: 04/18/2016  CLINICAL DATA:  Hydronephrosis and weakness. Acute on chronic kidney disease. EXAM: CT ABDOMEN AND PELVIS WITHOUT CONTRAST TECHNIQUE: Multidetector CT imaging of the abdomen and pelvis was performed following the standard protocol without IV contrast. COMPARISON:  Ultrasound 04/17/2016 FINDINGS: Lower chest: Minimal pleural nodularity along the right minor fissure on sequence 7, image 5 measuring roughly 3 mm. There are multiple punctate nodules at the lung bases, largest measuring up to 4 mm. No significant pleural effusions. Coronary artery calcifications. Hepatobiliary: Normal appearance of the liver and gallbladder. No significant biliary dilatation. Pancreas: Normal appearance of the pancreas without inflammation or duct dilatation. Spleen: Normal appearance of spleen without enlargement. Adrenals/Urinary Tract: 2.1 cm low-density nodule in the left adrenal gland. Hounsfield units are -7 and this is compatible with a benign adenoma. There appears to be a small adenoma in the right adrenal gland that measures approximately 1.6 cm. There are bilateral cysts. The largest cyst is in the left kidney interpolar region measuring up to 7.6 cm. There is 1 small hyperdense exophytic structure along the right kidney lower pole measuring 0.6 cm and this is indeterminate. Mild perinephric stranding bilaterally but no significant hydronephrosis. Urinary bladder is completely decompressed with a Foley catheter. There is gas within the urinary bladder and likely iatrogenic from the catheter placement. Minimal fullness of left renal pelvis. No evidence for kidney stones or ureter stones. Stomach/Bowel: Normal appearance of the stomach and duodenum. There is no evidence for bowel dilatation, obstruction or a focal bowel inflammation. Vascular/Lymphatic: Atherosclerotic  calcifications involving the aorta and iliac arteries without aneurysm. Small bilateral lymph nodes along the iliac nodal chains bilaterally. Small lymph nodes in the periaortic region. Overall, there is not significant lymph node enlargement. Reproductive: Calcifications in the prostate. No gross abnormality to the prostate or seminal vesicles. Other: Subcutaneous edema. Some edema in the lower pelvis. There is no significant abdominal or pelvic ascites. No evidence for free air. Subcutaneous gas in the left lower abdomen probably from an injection site. Musculoskeletal: Degenerative changes in the hips, right side greater the left. IMPRESSION: The mild hydronephrosis has resolved since the decompression of the urinary bladder with a Foley catheter. Currently, there is no significant hydronephrosis. Bilateral renal cysts. There is a small hyperdense indeterminate structure in the right kidney lower pole that measures 0.6 cm and this is too small to definitively characterize. Bilateral adrenal adenomas. Multiple tiny pulmonary nodules at the lung bases. Findings are nonspecific. No follow-up needed if [name] is low-risk (and has no known or suspected primary neoplasm). Non-contrast chest CT can be considered in 12 months if [name] is high-risk. This recommendation follows the consensus statement: Guidelines for Management of Incidental Pulmonary Nodules Detected on CT Images: From the Fleischner Society 2017; Radiology 2017; 284:228-243. Electronically Signed   By: Markus Daft M.D.   On: 04/18/2016 10:56   US Renal  Result Date: 04/17/2016 CLINICAL DATA:  Acute renal insufficiency, hypertension EXAM: RENAL / URINARY TRACT ULTRASOUND COMPLETE COMPARISON:  None. FINDINGS: Right Kidney: Length: 11.1 cm. There is mild fullness of the pelvocaliceal system with some cortical thinning present. There is slightly echogenic renal parenchyma suggesting chronic renal medical disease. A cyst is noted in the upper medial  right kidney of 3.5 x 2.8 x 3.9 cm. Left Kidney: Length: 12.4 cm. There is slight fullness of the pelvocaliceal system with some cortical thinning. The echogenicity of the renal parenchyma is increased consistent with chronic renal medical disease. Two cysts are present one of 7.0 x 7.0 x 6.4  cm, and the second of 3.7 x 2.7 x 0.3 cm. No solid renal lesion is seen. Bladder: The urinary bladder is urine distended but no abnormality is evident. IMPRESSION: 1. Mild bilateral hydronephrosis and cortical thinning. Consider CT of the abdomen pelvis to assess etiology. 2. Echogenic renal parenchyma consistent with chronic renal medical disease. 3. Simple appearing bilateral renal cysts. Electronically Signed   By: Ivar Drape M.D.   On: 04/17/2016 11:59     Scheduled Meds: . amLODipine  10 mg Oral Daily  . aspirin EC  81 mg Oral Daily  . heparin subcutaneous  5,000 Units Subcutaneous Q8H  . levothyroxine  25 mcg Oral QAC breakfast  . multivitamin-lutein  1 capsule Oral Daily  . polyethylene glycol  17 g Oral Daily  . sodium chloride flush  3 mL Intravenous Q12H   Continuous Infusions: . sodium chloride 150 mL/hr at 04/18/16 0800     LOS: 7 days   Time Spent in minutes   30 minutes  Khalila Buechner D.O. on 04/18/2016 at 1:05 PM  Between 7am to 7pm - Pager - [phone]  After 7pm go to [email or web address] - password [email or web address]  And look for the night coverage person covering for me after hours  [facility] Office  [phone]

## 2016-04-18 NOTE — Progress Notes (Signed)
Physical Therapy Treatment Patient Details Name: Glen [name] MRN: [id number] DOB: 04-Jun-1940 Today's Date: 04/18/2016    History of Present Illness 76 yo male with HTN, coronary artery disease, dyslipidemia, presents with lower extremities weakness and bilateral knee pain, unable to ambulate for the last 4 days before admission. On the physical exam found hemodynamically stable, his cr was elevated as well as wbc. Received one dose of Zosyn and was admitted to the ICU for further evaluation. Neurology consulted and  MRI of the back showed chronic degeneration. (see Dr. Sarajane Jews note on 04/11/2016 as well for further explain of pt's recalling facts of fall, etc from last month)     PT Comments    Pt not progressing with his mobility.  Required even more assist to get OOB.  Pt present with impaired cognition and poor active muscle mvmt with rigidity.  Required 3 assist to stand and attempt amb.  Pt was unable to functionally take any steps even using B platform EVA walker.  Pt started to "ski".  Returned to bed as tech arrived to take pt downstairs for an ABD CT.    Follow Up Recommendations  SNF     Equipment Recommendations       Recommendations for Other Services       Precautions / Restrictions Precautions Precautions: Fall Restrictions Weight Bearing Restrictions: No    Mobility  Bed Mobility Overal bed mobility: Needs Assistance Bed Mobility: Supine to Sit     Supine to sit: +2 for physical assistance;Total assist Sit to supine: +2 for physical assistance;Total assist   General bed mobility comments: required increased assist.  Increased c/o B LE pain esp R knee.  great difficulty initiating and completing motion.  Required Total Assist to transfer to EOB pt only offered 5%.    Transfers Overall transfer level: Needs assistance Equipment used: None;Bilateral platform walker (EVA walker) Transfers: Sit to/from [employer] Transfers Sit to Stand: Total  assist;+2 physical assistance;+2 safety/equipment Stand pivot transfers: Total assist;+2 physical assistance;+2 safety/equipment       General transfer comment: Required + 3 assist to stand and pivot.  Very rigid.  Poor initiation.  Very slow motor control.  Sit to stand several times with severe posterior lean and inability to achieve midline.   Ambulation/Gait             General Gait Details: pt was unable to functionally walk or take steps despite use of EVA walker.  Pt started to "ski".    Stairs            Wheelchair Mobility    Modified Rankin (Stroke Patients Only)       Balance                                    Cognition Arousal/Alertness: Awake/alert Behavior During Therapy: WFL for tasks assessed/performed Overall Cognitive Status: Difficult to assess                 General Comments: impaired cognition       slow to respond      delayed responses    Exercises      General Comments        Pertinent Vitals/Pain Pain Assessment: Faces Faces Pain Scale: Hurts little more Pain Location: B LE "knees" Pain Descriptors / Indicators: Grimacing;Constant Pain Intervention(s): Monitored during session    Home Living  Prior Function            PT Goals (current goals can now be found in the care plan section) Progress towards PT goals: Progressing toward goals    Frequency    Min 3X/week      PT Plan Current plan remains appropriate    Co-evaluation             End of Session Equipment Utilized During Treatment: Gait belt   Patient left: in bed     Time: 1008-1020 PT Time Calculation (min) (ACUTE ONLY): 12 min  Charges:  $Therapeutic Activity: 8-22 mins                    G Codes:      Rica Koyanagi  PTA [facility]  Acute  Rehab Pager      [phone]

## 2016-04-19 ENCOUNTER — Encounter (HOSPITAL_COMMUNITY): Payer: Self-pay

## 2016-04-19 DIAGNOSIS — C4492 Squamous cell carcinoma of skin, unspecified: Secondary | ICD-10-CM

## 2016-04-19 LAB — BASIC METABOLIC PANEL
ANION GAP: 12 (ref 5–15)
BUN: 129 mg/dL — ABNORMAL HIGH (ref 6–20)
CHLORIDE: 109 mmol/L (ref 101–111)
CO2: 15 mmol/L — AB (ref 22–32)
Calcium: 7.8 mg/dL — ABNORMAL LOW (ref 8.9–10.3)
Creatinine, Ser: 4.24 mg/dL — ABNORMAL HIGH (ref 0.61–1.24)
GFR calc non Af Amer: 12 mL/min — ABNORMAL LOW (ref >60)
GFR, EST AFRICAN AMERICAN: 14 mL/min — AB (ref >60)
GLUCOSE: 129 mg/dL — AB (ref 65–99)
Potassium: 3.4 mmol/L — ABNORMAL LOW (ref 3.5–5.1)
Sodium: 136 mmol/L (ref 135–145)

## 2016-04-19 LAB — CBC
HEMATOCRIT: 31.1 % — AB (ref 39.0–52.0)
HEMOGLOBIN: 10.7 g/dL — AB (ref 13.0–17.0)
MCH: 29.5 pg (ref 26.0–34.0)
MCHC: 34.4 g/dL (ref 30.0–36.0)
MCV: 85.7 fL (ref 78.0–100.0)
Platelets: 392 10*3/uL (ref 150–400)
RBC: 3.63 MIL/uL — ABNORMAL LOW (ref 4.22–5.81)
RDW: 13.5 % (ref 11.5–15.5)
WBC: 11.3 10*3/uL — AB (ref 4.0–10.5)

## 2016-04-19 MED ORDER — TAMSULOSIN HCL 0.4 MG PO CAPS
0.4000 mg | ORAL_CAPSULE | Freq: Every day | ORAL | Status: DC
  Administered 2016-04-19 – 2016-04-20 (×2): 0.4 mg via ORAL
  Filled 2016-04-19 (×2): qty 1

## 2016-04-19 MED ORDER — POTASSIUM CHLORIDE CRYS ER 20 MEQ PO TBCR
40.0000 meq | EXTENDED_RELEASE_TABLET | Freq: Once | ORAL | Status: AC
  Administered 2016-04-19: 40 meq via ORAL
  Filled 2016-04-19: qty 2

## 2016-04-19 MED ORDER — HYDRALAZINE HCL 25 MG PO TABS
25.0000 mg | ORAL_TABLET | Freq: Three times a day (TID) | ORAL | Status: DC
  Administered 2016-04-19 – 2016-04-20 (×4): 25 mg via ORAL
  Filled 2016-04-19 (×4): qty 1

## 2016-04-19 NOTE — Clinical Social Work Placement (Signed)
CSW confirmed with Narda Rutherford at [facility] that they would be able to take patient over the weekend if ready. [facility] Medicare authorization obtained.   Please call weekend CSW (ph#: [phone]) to facilitate discharge.      Raynaldo Opitz, [facility] Clinical Social Worker cell #: [phone]   CLINICAL SOCIAL WORK PLACEMENT  NOTE  Date:  04/19/2016  Patient Details  Name: Glen [name] MRN: [id number] Date of Birth: 07-14-1940  Clinical Social Work is seeking post-discharge placement for this patient at the [place] level of care (*CSW will initial, date and re-position this form in  chart as items are completed):  Yes   Patient/family provided with [facility] Work Department's list of facilities offering this level of care within the geographic area requested by the patient (or if unable, by the patient's family).  Yes   Patient/family informed of their freedom to choose among providers that offer the needed level of care, that participate in Medicare, Medicaid or managed care program needed by the patient, have an available bed and are willing to accept the patient.  Yes   Patient/family informed of [facility]'s ownership interest in [facility] and [facility], as well as of the fact that they are under no obligation to receive care at these facilities.  PASRR submitted to EDS on 04/14/16     PASRR number received on 04/14/16     Existing PASRR number confirmed on       FL2 transmitted to all facilities in geographic area requested by pt/family on 04/14/16     FL2 transmitted to all facilities within larger geographic area on       Patient informed that his/her managed care company has contracts with or will negotiate with certain facilities, including the following:        Yes   Patient/family informed of bed offers received.  Patient chooses bed at [facility]     Physician  recommends and patient chooses bed at      Patient to be transferred to [facility] on  .  Patient to be transferred to facility by       Patient family notified on   of transfer.  Name of family member notified:        PHYSICIAN Please sign FL2     Additional Comment:    _______________________________________________ Standley Brooking, LCSW 04/19/2016, 2:09 PM

## 2016-04-19 NOTE — Progress Notes (Signed)
[facility] Progress Note   Subjective: good UOP, creat and BUN down today. Pt eating, still tremors   Vitals:   04/18/16 0522 04/18/16 1440 04/18/16 2232 04/19/16 0638  BP: (!) 152/88 (!) 143/80 (!) 152/80 (!) 164/84  Pulse: 70 68 71 68  Resp: (!) _0 Temp: 98.4 F (36.9 C) 97.8 F (36.6 C) 98.7 F (37.1 C) 99 F (37.2 C)  TempSrc: Oral Oral Oral Oral  SpO2: 97% 99% 97% 97%  Weight: 103.1 kg (227 lb 4.8 oz)   106 kg (233 lb 11.2 oz)  Height:        Inpatient medications: . amLODipine  10 mg Oral Daily  . aspirin EC  81 mg Oral Daily  . heparin subcutaneous  5,000 Units Subcutaneous Q8H  . levothyroxine  25 mcg Oral QAC breakfast  . multivitamin-lutein  1 capsule Oral Daily  . polyethylene glycol  17 g Oral Daily  . sodium chloride flush  3 mL Intravenous Q12H  . tamsulosin  0.4 mg Oral Daily   . sodium chloride 150 mL/hr at 04/19/16 0428   acetaminophen **OR** acetaminophen, hydrALAZINE  Exam: Gen older adult male, not in distress, appears ill No rash, cyanosis or gangrene Sclera anicteric, throat clear and dry  No jvd or bruits Chest some insp coarse rales R base, L clear RRR no MRG Abd soft ntnd no mass or ascites +bs obese GU normal male w foley cath draining clear yellowish urine MS no joint effusions or deformity Ext no LE or UE edema / no wounds or ulcers Neuro is awake, Ox 3, weak and tremulous, no asterixis    UA 1/4 > no bact, 50 glu, >300 prot, 0-5 rbc/ wbc Renal US > 1/10 >> 11.1/ 12.4 cm kidneys, mild bilat hydronephrosis and cortical thinning.  Consider CT abd to assess etiology.   Home medications > norvasc, asa, T4, Hyzaar, MVI, statin ECHO >  LVEF 50-55%, severe conc LVH, G1DD CXR 04/11/16 > no active disease UPC ratio - 1.32 Repeat UA 1/10 > 100 prot, 0-5 rbc/ wbc  Date                            Creat               eGFR               CKD May 2017                    2.29                 29                     Stage IV Nov 2017                     2.33                 29                    Stage IV Apr 11 2016                  3.82 Jan 6                           3.55 Jan 8  3.82 Jan 9                           4.04                 15                    Stage V Apr 17, 2016               4.96                 12                    Stage V   Assessment: 1  Acute on CKD 3/4 due to obstruction/ urinary retention - improving now after Foley cath placement. CT did not show any reason for urinary retention.  Will need urology consult.  Cont IVF"s for now, azotemia improving.  Baseline CKD w creat 2.5  2  HTN - on norvasc only, vol is ok 3  LVH 4  CAD hx MI/ stents 5  HL   Plan - as above   Kelly Splinter MD [facility] pager [phone]   04/19/2016, 9:31 AM    Recent Labs Lab 04/17/16 0619 04/18/16 0552 04/19/16 0522  NA 136 135 136  K 4.0 3.7 3.4*  CL 108 108 109  CO2 15* 14* 15*  GLUCOSE 161* 124* 129*  BUN 127* 142* 129*  CREATININE 4.96* 4.95* 4.24*  CALCIUM 8.0* 7.9* 7.8*   No results for input(s): AST, ALT, ALKPHOS, BILITOT, PROT, ALBUMIN in the last 168 hours.  Recent Labs Lab 04/13/16 0543 04/18/16 0552 04/19/16 0522  WBC 11.8* 13.2* 11.3*  NEUTROABS 9.6*  --   --   HGB 12.1* 11.6* 10.7*  HCT 34.7* 33.6* 31.1*  MCV 87.8 87.3 85.7  PLT 349 457* 392   Iron/TIBC/Ferritin/ %Sat No results found for: IRON, TIBC, FERRITIN, IRONPCTSAT

## 2016-04-19 NOTE — Progress Notes (Signed)
PROGRESS NOTE    Glen [name]  [id number] DOB: 07-28-1940 DOA: 04/11/2016 PCP: Eulas Post, MD   Chief Complaint  [name] presents with  . Knee Pain    Bilateral  . Multiple Complaints    Brief Narrative:  HPI On 04/11/2016 by Dr. Murray Hodgkins 76 year old man PMH coronary artery disease, hypertension, hyperlipidemia, no neurologic history, who presents to the emergency department with profound generalized weakness. Initial evaluation revealed acute kidney injury, frequent PVCs and nonsustained V. tach on telemetry, elevated transaminases, leukocytosis and [name] was referred for admission, per EDP with concern for possible sepsis.  History obtained from [name] as well as wife at bedside. [name] and wife are difficult historians, chronicity is very difficult to determine, it is very difficult to ascertain accurate dates, however the [name] fell approximately one month ago, they believe December 8, he was shoveling snow, fell forward onto his hands and ribs. He did not hit his head, his back or bottom. He was able to immediately get up and for the next 3 weeks had no difficulty ambulating. He celebrated New Year's Eve without difficulty. He daily navigates a flight of stairs without difficulty normally. As best as I can ascertain, on New Year's Day the [name] found himself profoundly weak, especially in his legs, unable to get up out of bed. From January 1-January 4 he has been in bed with the exception of one episode where he fell out of bed and the fire department came to put him back in bed. He came to the hospital today because his friend insisted he do so. [name] denies any bowel or bladder problems. No sacral dysesthesia. No paresthesias or numbness throughout his body. His only complaint is profound generalized weakness and bilateral knee pain, especially with flexing, he locates the pain behind his knees. He is unable to sit up in bed without assistance. He reports that  he's been drinking very well and continuing to urinate normally. He's had no respiratory symptoms or recent respiratory illness. No fever or chills. His review of systems except as above is unrevealing. He has no neurologic history of note.  The [name] denies shortness of breath but his wife has noted tachypnea. 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, Stage IV/ Bilateral hydronephrosis -Despite IV fluid hydration, Renal functioning worsening, peaked to 4.96, BUN 127 -Creatinine 4.24 -Losartan/HCTZ discontinued -Nephrology consultation appreciated -Renal US showed mild bilateral hydronephrosis -monitor intake/output. Good urine output over past 24hrs 4200 -Foley catheter placed for urinary retention, bladder scan on 1/10 showed 999cc  -CT abd/pelvis: Mild hydronephrosis resolved since decompression of urinary bladder -started [name] on flomax -Will speak with urology.  Lower extremity weakness -MRI showed degenerative disc disease -PT consult is recommended SNF -Continue pain control  Leukocytosis -WBC 20.7 upon admission -Trended downward, currently 11.3 -No signs of infection  Essential hypertension -Continue amlodipine -BP uncontrolled, will start on hydralzine 25mg  TID -Continue hydralazine as needed -Losartan/HCTZ discontinued  Hypothyroidism -Continue Synthroid  Frequent PVCs/PAT/PSVT -Cardiology consulted with recommendations to start bisoprolol, conservative management -Beta blocker was held as [name] currently bradycardic -Echocardiogram 99991111, grade 1 diastolic dysfunction -outpatient follow up with Dr. Percival Spanish  Metabolic acidosis -Likely from renal failure -Continue to follow BMP  Pulmonary nodules -Seen on CT abd/pelvis: multiple tiny nodules, needs repeat CT scan in 12 months if high risk, if low risk- no follow up needed  Posterior shoulder wound -wound care consulted -[name] recently had a biopsy done by dermatology  DVT Prophylaxis   heparin  Code Status: Full  Family Communication: None at bedside  Disposition Plan: Admitted, pending improvement in creatinine. SNF when medically stable.   Consultants Nephrology Cardiology Urology  Procedures  Echocardiogram Renal US  Antibiotics   Anti-infectives    Start     Dose/Rate Route Frequency Ordered Stop   04/12/16 0200  piperacillin-tazobactam (ZOSYN) IVPB 3.375 g  Status:  Discontinued     3.375 g 12.5 mL/hr over 240 Minutes Intravenous Every 8 hours 04/11/16 1754 04/12/16 0003   04/11/16 1730  piperacillin-tazobactam (ZOSYN) IVPB 3.375 g     3.375 g 100 mL/hr over 30 Minutes Intravenous STAT 04/11/16 1722 04/11/16 1839      Subjective:   Glen [name] seen and examined today.  [name] continues to feel weak.  Feels cold.  Denies any chest pain, shortness of breath, abdominal pain, nausea or vomiting, diarrhea constipation.  Objective:   Vitals:   04/18/16 0522 04/18/16 1440 04/18/16 2232 04/19/16 0638  BP: (!) 152/88 (!) 143/80 (!) 152/80 (!) 164/84  Pulse: 70 68 71 68  Resp: (!) 25 20 20 20   Temp: 98.4 F (36.9 C) 97.8 F (36.6 C) 98.7 F (37.1 C) 99 F (37.2 C)  TempSrc: Oral Oral Oral Oral  SpO2: 97% 99% 97% 97%  Weight: 103.1 kg (227 lb 4.8 oz)   106 kg (233 lb 11.2 oz)  Height:        Intake/Output Summary (Last 24 hours) at 04/19/16 1158 Last data filed at 04/19/16 1000  Gross per 24 hour  Intake             4920 ml  Output             3475 ml  Net             1445 ml   Filed Weights   04/18/16 0500 04/18/16 0522 04/19/16 0638  Weight: 103.1 kg (227 lb 4.7 oz) 103.1 kg (227 lb 4.8 oz) 106 kg (233 lb 11.2 oz)    Exam  General: Well developed, chronically ill appearing, NAD  HEENT: NCAT, mucous membranes moist.   Cardiovascular: S1 S2 auscultated, RRR, no murmurs  Respiratory: Coarse breath sounds.   Abdomen: Soft, nontender, nondistended, + bowel sounds  Extremities: warm dry without cyanosis clubbing or  edema  Neuro: AAOx3, nonfocal, slight tremors  Skin: wound noted on posterior right shoulder  Psych: Normal affect and demeanor   Data Reviewed: I have personally reviewed following labs and imaging studies  CBC:  Recent Labs Lab 04/13/16 0543 04/18/16 0552 04/19/16 0522  WBC 11.8* 13.2* 11.3*  NEUTROABS 9.6*  --   --   HGB 12.1* 11.6* 10.7*  HCT 34.7* 33.6* 31.1*  MCV 87.8 87.3 85.7  PLT 349 457* 0000000   Basic Metabolic Panel:  Recent Labs Lab 04/15/16 0623 04/16/16 0640 04/17/16 0619 04/18/16 0552 04/19/16 0522  NA 140 140 136 135 136  K 3.9 3.8 4.0 3.7 3.4*  CL 111 110 108 108 109  CO2 14* 17* 15* 14* 15*  GLUCOSE 167* 140* 161* 124* 129*  BUN 106* 124* 127* 142* 129*  CREATININE 3.82* 4.04* 4.96* 4.95* 4.24*  CALCIUM 8.2* 8.1* 8.0* 7.9* 7.8*  MG 2.6*  --   --   --   --    GFR: Estimated Creatinine Clearance: 18.9 mL/min (by C-G formula based on SCr of 4.24 mg/dL (H)). Liver Function Tests: No results for input(s): AST, ALT, ALKPHOS, BILITOT, PROT, ALBUMIN in the last 168 hours. No results for  input(s): LIPASE, AMYLASE in the last 168 hours. No results for input(s): AMMONIA in the last 168 hours. Coagulation Profile: No results for input(s): INR, PROTIME in the last 168 hours. Cardiac Enzymes:  Recent Labs Lab 04/15/16 0623  TROPONINI 0.04*   BNP (last 3 results) No results for input(s): PROBNP in the last 8760 hours. HbA1C: No results for input(s): HGBA1C in the last 72 hours. CBG: No results for input(s): GLUCAP in the last 168 hours. Lipid Profile: No results for input(s): CHOL, HDL, LDLCALC, TRIG, CHOLHDL, LDLDIRECT in the last 72 hours. Thyroid Function Tests: No results for input(s): TSH, T4TOTAL, FREET4, T3FREE, THYROIDAB in the last 72 hours. Anemia Panel: No results for input(s): VITAMINB12, FOLATE, FERRITIN, TIBC, IRON, RETICCTPCT in the last 72 hours. Urine analysis:    Component Value Date/Time   COLORURINE YELLOW 04/17/2016 1632    APPEARANCEUR HAZY (A) 04/17/2016 1632   LABSPEC 1.011 04/17/2016 1632   PHURINE 5.0 04/17/2016 1632   GLUCOSEU NEGATIVE 04/17/2016 1632   HGBUR NEGATIVE 04/17/2016 [place] 04/17/2016 1632   KETONESUR NEGATIVE 04/17/2016 1632   PROTEINUR 100 (A) 04/17/2016 1632   NITRITE NEGATIVE 04/17/2016 1632   LEUKOCYTESUR NEGATIVE 04/17/2016 1632   Sepsis Labs: @LABRCNTIP (procalcitonin:4,lacticidven:4)  ) Recent Results (from the past 240 hour(s))  Urine culture     Status: None   Collection Time: 04/11/16  5:35 PM  Result Value Ref Range Status   Specimen Description URINE, RANDOM  Final   Special Requests NONE  Final   Culture NO GROWTH Performed at [facility]   Final   Report Status 04/13/2016 FINAL  Final  Blood culture (routine x 2)     Status: None   Collection Time: 04/11/16  5:46 PM  Result Value Ref Range Status   Specimen Description BLOOD LEFT WRIST  Final   Special Requests BOTTLES DRAWN AEROBIC AND ANAEROBIC 5CC  Final   Culture   Final    NO GROWTH 5 DAYS Performed at [facility]    Report Status 04/16/2016 FINAL  Final  Blood culture (routine x 2)     Status: None   Collection Time: 04/11/16  5:56 PM  Result Value Ref Range Status   Specimen Description BLOOD LEFT ANTECUBITAL  Final   Special Requests BOTTLES DRAWN AEROBIC AND ANAEROBIC 5CC  Final   Culture   Final    NO GROWTH 5 DAYS Performed at [facility]    Report Status 04/16/2016 FINAL  Final  MRSA PCR Screening     Status: None   Collection Time: 04/12/16  1:23 AM  Result Value Ref Range Status   MRSA by PCR NEGATIVE NEGATIVE Final    Comment:        The GeneXpert MRSA Assay (FDA approved for NASAL specimens only), is one component of a comprehensive MRSA colonization surveillance program. It is not intended to diagnose MRSA infection nor to guide or monitor treatment for MRSA infections.       Radiology Studies: Ct Abdomen Pelvis Wo  Contrast  Result Date: 04/18/2016 CLINICAL DATA:  Hydronephrosis and weakness. Acute on chronic kidney disease. EXAM: CT ABDOMEN AND PELVIS WITHOUT CONTRAST TECHNIQUE: Multidetector CT imaging of the abdomen and pelvis was performed following the standard protocol without IV contrast. COMPARISON:  Ultrasound 04/17/2016 FINDINGS: Lower chest: Minimal pleural nodularity along the right minor fissure on sequence 7, image 5 measuring roughly 3 mm. There are multiple punctate nodules at the lung bases, largest measuring up to  4 mm. No significant pleural effusions. Coronary artery calcifications. Hepatobiliary: Normal appearance of the liver and gallbladder. No significant biliary dilatation. Pancreas: Normal appearance of the pancreas without inflammation or duct dilatation. Spleen: Normal appearance of spleen without enlargement. Adrenals/Urinary Tract: 2.1 cm low-density nodule in the left adrenal gland. Hounsfield units are -7 and this is compatible with a benign adenoma. There appears to be a small adenoma in the right adrenal gland that measures approximately 1.6 cm. There are bilateral cysts. The largest cyst is in the left kidney interpolar region measuring up to 7.6 cm. There is 1 small hyperdense exophytic structure along the right kidney lower pole measuring 0.6 cm and this is indeterminate. Mild perinephric stranding bilaterally but no significant hydronephrosis. Urinary bladder is completely decompressed with a Foley catheter. There is gas within the urinary bladder and likely iatrogenic from the catheter placement. Minimal fullness of left renal pelvis. No evidence for kidney stones or ureter stones. Stomach/Bowel: Normal appearance of the stomach and duodenum. There is no evidence for bowel dilatation, obstruction or a focal bowel inflammation. Vascular/Lymphatic: Atherosclerotic calcifications involving the aorta and iliac arteries without aneurysm. Small bilateral lymph nodes along the iliac nodal  chains bilaterally. Small lymph nodes in the periaortic region. Overall, there is not significant lymph node enlargement. Reproductive: Calcifications in the prostate. No gross abnormality to the prostate or seminal vesicles. Other: Subcutaneous edema. Some edema in the lower pelvis. There is no significant abdominal or pelvic ascites. No evidence for free air. Subcutaneous gas in the left lower abdomen probably from an injection site. Musculoskeletal: Degenerative changes in the hips, right side greater the left. IMPRESSION: The mild hydronephrosis has resolved since the decompression of the urinary bladder with a Foley catheter. Currently, there is no significant hydronephrosis. Bilateral renal cysts. There is a small hyperdense indeterminate structure in the right kidney lower pole that measures 0.6 cm and this is too small to definitively characterize. Bilateral adrenal adenomas. Multiple tiny pulmonary nodules at the lung bases. Findings are nonspecific. No follow-up needed if [name] is low-risk (and has no known or suspected primary neoplasm). Non-contrast chest CT can be considered in 12 months if [name] is high-risk. This recommendation follows the consensus statement: Guidelines for Management of Incidental Pulmonary Nodules Detected on CT Images: From the Fleischner Society 2017; Radiology 2017; 284:228-243. Electronically Signed   By: Markus Daft M.D.   On: 04/18/2016 10:56     Scheduled Meds: . amLODipine  10 mg Oral Daily  . aspirin EC  81 mg Oral Daily  . heparin subcutaneous  5,000 Units Subcutaneous Q8H  . levothyroxine  25 mcg Oral QAC breakfast  . multivitamin-lutein  1 capsule Oral Daily  . polyethylene glycol  17 g Oral Daily  . sodium chloride flush  3 mL Intravenous Q12H  . tamsulosin  0.4 mg Oral Daily   Continuous Infusions: . sodium chloride 150 mL/hr at 04/19/16 1051     LOS: 8 days   Time Spent in minutes   30 minutes  Jannelle Notaro D.O. on 04/19/2016 at 11:58  AM  Between 7am to 7pm - Pager - [phone]  After 7pm go to [email or web address] - password [email or web address]  And look for the night coverage person covering for me after hours  [facility] Office  [phone]

## 2016-04-20 DIAGNOSIS — Z8249 Family history of ischemic heart disease and other diseases of the circulatory system: Secondary | ICD-10-CM | POA: Diagnosis not present

## 2016-04-20 DIAGNOSIS — A419 Sepsis, unspecified organism: Secondary | ICD-10-CM | POA: Diagnosis not present

## 2016-04-20 DIAGNOSIS — R402431 Glasgow coma scale score 3-8, in the field [EMT or ambulance]: Secondary | ICD-10-CM | POA: Diagnosis not present

## 2016-04-20 DIAGNOSIS — I2510 Atherosclerotic heart disease of native coronary artery without angina pectoris: Secondary | ICD-10-CM | POA: Diagnosis not present

## 2016-04-20 DIAGNOSIS — J9811 Atelectasis: Secondary | ICD-10-CM | POA: Diagnosis not present

## 2016-04-20 DIAGNOSIS — I493 Ventricular premature depolarization: Secondary | ICD-10-CM | POA: Diagnosis not present

## 2016-04-20 DIAGNOSIS — E039 Hypothyroidism, unspecified: Secondary | ICD-10-CM | POA: Diagnosis not present

## 2016-04-20 DIAGNOSIS — R404 Transient alteration of awareness: Secondary | ICD-10-CM | POA: Diagnosis not present

## 2016-04-20 DIAGNOSIS — I10 Essential (primary) hypertension: Secondary | ICD-10-CM | POA: Diagnosis not present

## 2016-04-20 DIAGNOSIS — G9340 Encephalopathy, unspecified: Secondary | ICD-10-CM | POA: Diagnosis not present

## 2016-04-20 DIAGNOSIS — I468 Cardiac arrest due to other underlying condition: Secondary | ICD-10-CM | POA: Diagnosis not present

## 2016-04-20 DIAGNOSIS — R4020 Unspecified coma: Secondary | ICD-10-CM | POA: Diagnosis not present

## 2016-04-20 DIAGNOSIS — M6281 Muscle weakness (generalized): Secondary | ICD-10-CM | POA: Diagnosis not present

## 2016-04-20 DIAGNOSIS — C4492 Squamous cell carcinoma of skin, unspecified: Secondary | ICD-10-CM | POA: Diagnosis not present

## 2016-04-20 DIAGNOSIS — N1330 Unspecified hydronephrosis: Secondary | ICD-10-CM | POA: Diagnosis not present

## 2016-04-20 DIAGNOSIS — Z4682 Encounter for fitting and adjustment of non-vascular catheter: Secondary | ICD-10-CM | POA: Diagnosis not present

## 2016-04-20 DIAGNOSIS — Z79899 Other long term (current) drug therapy: Secondary | ICD-10-CM | POA: Diagnosis not present

## 2016-04-20 DIAGNOSIS — R531 Weakness: Secondary | ICD-10-CM | POA: Diagnosis not present

## 2016-04-20 DIAGNOSIS — Z9861 Coronary angioplasty status: Secondary | ICD-10-CM | POA: Diagnosis not present

## 2016-04-20 DIAGNOSIS — R740 Nonspecific elevation of levels of transaminase and lactic acid dehydrogenase [LDH]: Secondary | ICD-10-CM | POA: Diagnosis not present

## 2016-04-20 DIAGNOSIS — I959 Hypotension, unspecified: Secondary | ICD-10-CM | POA: Diagnosis not present

## 2016-04-20 DIAGNOSIS — D649 Anemia, unspecified: Secondary | ICD-10-CM | POA: Diagnosis present

## 2016-04-20 DIAGNOSIS — N138 Other obstructive and reflux uropathy: Secondary | ICD-10-CM | POA: Diagnosis present

## 2016-04-20 DIAGNOSIS — I129 Hypertensive chronic kidney disease with stage 1 through stage 4 chronic kidney disease, or unspecified chronic kidney disease: Secondary | ICD-10-CM | POA: Diagnosis present

## 2016-04-20 DIAGNOSIS — I252 Old myocardial infarction: Secondary | ICD-10-CM | POA: Diagnosis not present

## 2016-04-20 DIAGNOSIS — R6521 Severe sepsis with septic shock: Secondary | ICD-10-CM | POA: Diagnosis not present

## 2016-04-20 DIAGNOSIS — N390 Urinary tract infection, site not specified: Secondary | ICD-10-CM | POA: Diagnosis present

## 2016-04-20 DIAGNOSIS — Z452 Encounter for adjustment and management of vascular access device: Secondary | ICD-10-CM | POA: Diagnosis not present

## 2016-04-20 DIAGNOSIS — N189 Chronic kidney disease, unspecified: Secondary | ICD-10-CM | POA: Diagnosis not present

## 2016-04-20 DIAGNOSIS — N289 Disorder of kidney and ureter, unspecified: Secondary | ICD-10-CM | POA: Diagnosis not present

## 2016-04-20 DIAGNOSIS — J690 Pneumonitis due to inhalation of food and vomit: Secondary | ICD-10-CM | POA: Diagnosis not present

## 2016-04-20 DIAGNOSIS — D72829 Elevated white blood cell count, unspecified: Secondary | ICD-10-CM | POA: Diagnosis not present

## 2016-04-20 DIAGNOSIS — Z7982 Long term (current) use of aspirin: Secondary | ICD-10-CM | POA: Diagnosis not present

## 2016-04-20 DIAGNOSIS — N184 Chronic kidney disease, stage 4 (severe): Secondary | ICD-10-CM | POA: Diagnosis not present

## 2016-04-20 DIAGNOSIS — E872 Acidosis: Secondary | ICD-10-CM | POA: Diagnosis not present

## 2016-04-20 DIAGNOSIS — N179 Acute kidney failure, unspecified: Secondary | ICD-10-CM | POA: Diagnosis not present

## 2016-04-20 DIAGNOSIS — K921 Melena: Secondary | ICD-10-CM | POA: Diagnosis present

## 2016-04-20 DIAGNOSIS — E785 Hyperlipidemia, unspecified: Secondary | ICD-10-CM | POA: Diagnosis not present

## 2016-04-20 DIAGNOSIS — E874 Mixed disorder of acid-base balance: Secondary | ICD-10-CM | POA: Diagnosis not present

## 2016-04-20 DIAGNOSIS — J9601 Acute respiratory failure with hypoxia: Secondary | ICD-10-CM | POA: Diagnosis not present

## 2016-04-20 DIAGNOSIS — R262 Difficulty in walking, not elsewhere classified: Secondary | ICD-10-CM | POA: Diagnosis not present

## 2016-04-20 DIAGNOSIS — R339 Retention of urine, unspecified: Secondary | ICD-10-CM | POA: Diagnosis not present

## 2016-04-20 LAB — BASIC METABOLIC PANEL
Anion gap: 10 (ref 5–15)
BUN: 110 mg/dL — AB (ref 6–20)
CHLORIDE: 108 mmol/L (ref 101–111)
CO2: 15 mmol/L — AB (ref 22–32)
Calcium: 7.8 mg/dL — ABNORMAL LOW (ref 8.9–10.3)
Creatinine, Ser: 3.7 mg/dL — ABNORMAL HIGH (ref 0.61–1.24)
GFR calc Af Amer: 17 mL/min — ABNORMAL LOW (ref >60)
GFR calc non Af Amer: 15 mL/min — ABNORMAL LOW (ref >60)
GLUCOSE: 141 mg/dL — AB (ref 65–99)
POTASSIUM: 3.4 mmol/L — AB (ref 3.5–5.1)
Sodium: 133 mmol/L — ABNORMAL LOW (ref 135–145)

## 2016-04-20 MED ORDER — TAMSULOSIN HCL 0.4 MG PO CAPS: 0.4000 mg | ORAL_CAPSULE | Freq: Every day | ORAL | 0 refills | Status: AC

## 2016-04-20 MED ORDER — POTASSIUM CHLORIDE CRYS ER 20 MEQ PO TBCR
40.0000 meq | EXTENDED_RELEASE_TABLET | Freq: Once | ORAL | Status: AC
  Administered 2016-04-20: 40 meq via ORAL
  Filled 2016-04-20: qty 2

## 2016-04-20 MED ORDER — HYDRALAZINE HCL 25 MG PO TABS: 25.0000 mg | ORAL_TABLET | Freq: Three times a day (TID) | ORAL | 0 refills | Status: AC

## 2016-04-20 MED ORDER — POLYETHYLENE GLYCOL 3350 17 G PO PACK: 17.0000 g | PACK | Freq: Every day | ORAL | 0 refills | Status: AC

## 2016-04-20 NOTE — Discharge Instructions (Signed)
Deconditioning °Deconditioning refers to the changes in your body that occur during a period of inactivity. The changes happen in your heart, lungs, and muscles. They decrease your ability to be active, and they make you feel tired and weak. °There are three stages of deconditioning: °· Mild deconditioning. At this stage, you will notice a change in your ability to do your usual exercise activities, such as running, biking, or swimming. °· Moderate deconditioning. At this stage, you will notice a change in your ability to do normal everyday activities, such as walking, grocery shopping, and doing chores. °· Severe deconditioning. At this stage, you will notice a change in your ability to do minimal activity or normal self-care. °Deconditioning can occur after only a few days of inactivity. The longer the period of inactivity, the more severe the deconditioning will be, and the longer it will take to return to your previous level of functioning. °What are the causes? °Deconditioning is often caused by inactivity due to: °· Illnesses, such as cancer, stroke, heart attack, fibromyalgia, and chronic fatigue syndrome. °· Injuries, especially back injuries, broken bones, and ligament and tendon injuries. °· A long stay in the hospital. °· Pregnancy, especially if long periods of bed rest are needed. °What increases the risk? °This condition is more likely to develop in: °· People who are hospitalized. °· People on bed rest. °· People who are obese. °· People with poor nutrition. °· Elderly adults. °· People with injuries or illnesses that interfere with movement and activity. °What are the signs or symptoms? °Symptoms of deconditioning include: °· Weakness. °· Tiredness. °· Shortness of breath with minor exertion. °· A faster-than-normal heartbeat. You may not notice this without taking your pulse. °· Pain or discomfort with activity. °· Decreased strength. °· Decreased sense of balance. °· Decreased  endurance. °· Difficulty doing your usual forms of exercise. °· Difficulty doing activities of daily living, such as grocery shopping or chores. °· Difficulty walking around the house and doing basic self-care, such as getting to the bathroom, preparing meals, or doing laundry. °How is this diagnosed? °Deconditioning is diagnosed based on your medical history and a physical exam. During the physical exam, your health care provider will check for signs of deconditioning, such as: °· Decreased size of muscles. °· Decreased strength. °· Trouble with balance. °· Shortness of breath or abnormally increased heart rate after minor exertion. °How is this treated? °Treatment for deconditioning usually involves following a structured exercise program in which activity is increased gradually. Your health care provider will determine which exercises are right for you. The exercise program will likely include aerobic exercise and strength training: °· Aerobic exercise helps improve the functioning of the heart and lungs as well as the muscles. °· Strength training helps improve muscle size and strength. °Both of these types of exercise will improve your endurance. You may be referred to a physical therapist who can create a safe strengthening program for you to follow. °Follow these instructions at home: °· Follow the exercise program that is recommended by your health care provider or physical therapist. °· Do not increase your exercise any faster than directed. °· Eat a healthy diet. °· Do not use any products that contain nicotine or tobacco, such as cigarettes and e-cigarettes. If you need help quitting, ask your health care provider. °· Take over-the-counter and prescription medicines only as told by your health care provider. °· Keep all follow-up visits as told by your health care provider. This is important. °Contact a   health care provider if:  You are not able to carry out the prescribed exercise program.  You are  becoming more and more fatigued and weak.  You become light-headed when rising to a sitting or standing position.  Your level of endurance decreases after it has improved. Get help right away if:  You have chest pain.  You are very short of breath.  You have any episodes of passing out. This information is not intended to replace advice given to you by your health care provider. Make sure you discuss any questions you have with your health care provider. Document Released: 08/09/2013 Document Revised: 10/13/2015 Document Reviewed: 06/24/2015 Elsevier Interactive Patient Education  2017 [employer]   Acute Kidney Injury, Adult Acute kidney injury (AKI) occurs when there is sudden (acute) damage to the kidneys.A small amount of kidney damage may not cause problems, but a large amount of damage may make it difficult or impossible for the kidneys to work the way they should. AKI may develop into long-lasting (chronic) kidney disease. Early detection and treatment of AKI may prevent kidney damage from becoming permanent or getting worse. What are the causes? Common causes of this condition include:  A problem with blood flow to the kidneys. This may be caused by:  Blood loss.  Heart and blood vessel (cardiovascular) disease.  Severe burns.  Liver disease.  Direct damage to the kidneys. This may be caused by:  Some medicines.  A kidney infection.  Poisoning.  Being around or in contact with poisonous (toxic) substances.  A surgical wound.  A hard, direct force to the kidney area.  A sudden blockage of urine flow. This may be caused by:  Cancer.  Kidney stones.  Enlarged prostate in males. What are the signs or symptoms? Symptoms develop slowly and may not be obvious until the kidney damage becomes severe. It is possible to have AKI for years without showing any symptoms. Symptoms of this condition can include:  Swelling (edema) of the face, legs, ankles, or  feet.  Numbness, tingling, or loss of feeling (sensation) in the hands or feet.  Tiredness (lethargy).  Nausea or vomiting.  Confusion or trouble concentrating.  Problems with urination, such as:  Painful or burning feeling during urination.  Decreased urine production.  Frequent urination, especially at night.  Bloody urine.  Muscle twitches and cramps, especially in the legs.  Shortness of breath.  Weakness.  Constant itchiness.  Loss of appetite.  Metallic taste in the mouth.  Trouble sleeping.  Pale lining of the eyelids and surface of the eye (conjunctiva). How is this diagnosed? This condition may be diagnosed with various tests. Tests may include:  Blood tests.  Urine tests.  Imaging tests.  A test in which a sample of tissue is removed from the kidneys to be looked at under a microscope (kidney biopsy). How is this treated? Treatment of AKIvaries depending on the cause and severity of the kidney damage. In mild cases, treatment may not be needed. The kidneys may heal on their own. If AKI is more severe, your health care provider will treat the cause of the kidney damage, help the kidneys heal, and prevent problems from occurring. Severe cases may require a procedure to remove toxic wastes from the body (dialysis) or surgery to repair kidney damage. Surgery may involve:  Repair of a torn kidney.  Removal of a urine flow obstruction. Follow these instructions at home:  Follow your prescribed diet.  Take over-the-counter and prescription medicines only  as told by your health care provider.  Do not take any new medicines unless approved by your health care provider. Many medicines can worsen your kidney damage.  Do not take any vitamin and mineral supplements unless approved by your health care provider. Many nutritional supplements can worsen your kidney damage.  The dose of some medicines that you take may need to be adjusted.  Do not use any  tobacco products, such as cigarettes, chewing tobacco, and e-cigarettes. If you need help quitting, ask your health care provider.  Keep all follow-up visits as told by your health care provider. This is important.  Keep track of your blood pressure. Report changes in your blood pressure as told by your health care provider.  Achieve and maintain a healthy weight. If you need help with this, ask your health care provider.  Start or continue an exercise plan. Try to exercise at least 30 minutes a day, 5 days a week.  Stay current with immunizations as told by your health care provider. Where to find more information:  American Association of Kidney Patients: BombTimer.gl  National Kidney Foundation: www.kidney.[place]: https://mathis.com/  [facility]: www.lifeoptions.org and www.kidneyschool.org Contact a health care provider if:  Your symptoms get worse.  You develop new symptoms. Get help right away if:  You develop symptoms of end-stage kidney disease, which include:  Headaches.  Abnormally dark or light skin.  Numbness in the hands or feet.  Easy bruising.  Frequent hiccups.  Chest pain.  Shortness of breath.  End of menstruation in women.  You have a fever.  You have decreased urine production.  You have pain or bleeding when you urinate. This information is not intended to replace advice given to you by your health care provider. Make sure you discuss any questions you have with your health care provider. Document Released: 10/08/2010 Document Revised: 11/02/2015 Document Reviewed: 11/22/2011 Elsevier Interactive Patient Education  2017 [employer].  Acute Urinary Retention, Male Acute urinary retention is when you are unable to pee (urinate). Acute urinary retention is common in older men. Prostates can get bigger, which blocks the flow of pee. Follow these instructions at home:  Drink enough fluids to keep your  pee clear or pale yellow.  If you are sent home with a tube that drains the bladder (catheter), there will be a drainage bag attached to it. There are two types of bags. One is big that you can wear at night without having to empty it. One is smaller and needs to be emptied more often.  Keep the drainage bag empty.  Keep the drainage bag lower than your catheter.  Only take medicine as told by your doctor. Contact a doctor if:  You have a low-grade fever.  You have spasms or you are leaking pee when you have spasms. Get help right away if:  You have chills or a fever.  Your catheter stops draining pee.  Your catheter falls out.  You have increased bleeding that does not stop after you have rested and increased the amount of fluids you had been drinking. This information is not intended to replace advice given to you by your health care provider. Make sure you discuss any questions you have with your health care provider. Document Released: 09/11/2007 Document Revised: 08/31/2015 Document Reviewed: 09/03/2012 Elsevier Interactive Patient Education  2017 [employer].

## 2016-04-20 NOTE — Discharge Summary (Signed)
Physician Discharge Summary  Glen [name] [id number] DOB: May 08, 1940 DOA: 04/11/2016  PCP: Eulas Post, MD  Admit date: 04/11/2016 Discharge date: 04/20/2016  Time spent: 45 minutes  Recommendations for Outpatient Follow-up:  Patient will be discharged to [facility] skilled nursing facility. Continue physical and occupational therapy.  Patient will need to follow up with primary care provider within one week of discharge, repeat BMP.  Follow up with urology, Dr. Alyson Ingles, in 1-2 weeks. Follow up with cardiology, Dr. Percival Spanish. Patient should continue medications as prescribed.  Patient should follow a heart healthy diet.   Discharge Diagnoses:  AKI on chronic kidney disease, Stage IV/ Bilateral hydronephrosis Lower extremity weakness Leukocytosis Essential hypertension Hypothyroidism Frequent PVCs/PAT/PSVT Metabolic acidosis Pulmonary nodules Posterior shoulder wound  Discharge Condition: Stable  Diet recommendation: heart healthy  Filed Weights   04/18/16 0522 04/19/16 0638 04/20/16 0456  Weight: 103.1 kg (227 lb 4.8 oz) 106 kg (233 lb 11.2 oz) 105.9 kg (233 lb 6.4 oz)    History of present illness:  On 04/11/2016 by Dr. Murray Hodgkins 76 year old man PMH coronary artery disease, hypertension, hyperlipidemia, no neurologic history, who presents to the emergency department with profound generalized weakness. Initial evaluation revealed acute kidney injury, frequent PVCs and nonsustained V. tach on telemetry, elevated transaminases, leukocytosis and patient was referred for admission, per EDP with concern for possible sepsis.  History obtained from patient as well as wife at bedside. Patient and wife are difficult historians, chronicity is very difficult to determine, it is very difficult to ascertain accurate dates, however the patient fell approximately one month ago, they believe December 8, he was shoveling snow, fell forward onto his hands and ribs. He did not hit  his head, his back or bottom. He was able to immediately get up and for the next 3 weeks had no difficulty ambulating. He celebrated New Year's Eve without difficulty. He daily navigates a flight of stairs without difficulty normally. As best as I can ascertain, on New Year's Day the patient found himself profoundly weak, especially in his legs, unable to get up out of bed. From January 1-January 4 he has been in bed with the exception of one episode where he fell out of bed and the fire department came to put him back in bed. He came to the hospital today because his friend insisted he do so. Patient denies any bowel or bladder problems. No sacral dysesthesia. No paresthesias or numbness throughout his body. His only complaint is profound generalized weakness and bilateral knee pain, especially with flexing, he locates the pain behind his knees. He is unable to sit up in bed without assistance. He reports that he's been drinking very well and continuing to urinate normally. He's had no respiratory symptoms or recent respiratory illness. No fever or chills. His review of systems except as above is unrevealing. He has no neurologic history of note.  The patient denies shortness of breath but his wife has noted tachypnea.  Hospital Course:  AKI on chronic kidney disease, Stage IV/ Bilateral hydronephrosis -Despite IV fluid hydration, Renal functioning worsening, peaked to 4.96, BUN 127 -Creatinine now 3.7 -Losartan/HCTZ discontinued -Nephrology consultation appreciated -Renal US showed mild bilateral hydronephrosis -monitor intake/output. Good urine output over past 24hrs 3650 -Foley catheter placed for urinary retention, bladder scan on 1/10 showed 999cc  -CT abd/pelvis: Mild hydronephrosis resolved since decompression of urinary bladder -started patient on flomax -Spoke with urology, Dr. Alyson Ingles, via phone.  Continue flomax, keep foley catheter in, follow up with urology as  an outpatient. -Repeat  BMP in one week.   Lower extremity weakness -MRI showed degenerative disc disease -PT consult is recommended SNF -Continue pain control  Leukocytosis -WBC 20.7 upon admission -Trended downward, currently 11.3 -No signs of infection  Essential hypertension -Continue amlodipine -BP uncontrolled, will start on hydralzine 25mg  TID -Continue hydralazine as needed -Losartan/HCTZ discontinued  Hypothyroidism -Continue Synthroid  Frequent PVCs/PAT/PSVT -Cardiology consulted with recommendations to start bisoprolol, conservative management: Dr. Allison Quarry note 04/16/16: Would start with low dose Bisoprolol , however with his profound weakness & total lack of Sx, would prefer expectant management for now -Beta blocker was held as patient currently bradycardic -Echocardiogram [id number], grade 1 diastolic dysfunction -outpatient follow up with Dr. Percival Spanish  Metabolic acidosis -Likely from renal failure -Continue to follow BMP  Pulmonary nodules -Seen on CT abd/pelvis: multiple tiny nodules, needs repeat CT scan in 12 months if high risk, if low risk- no follow up needed  Posterior shoulder wound -wound care consulted -patient recently had a biopsy done by dermatology as an outpatient  Consultants Nephrology Cardiology Urology  Procedures  Echocardiogram Renal US  Discharge Exam: Vitals:   04/19/16 2040 04/20/16 0456  BP: (!) 157/84 (!) 141/81  Pulse: 90 74  Resp: 20 20  Temp: 99.9 F (37.7 C) 98 F (36.7 C)   Patient continues to feel weak.  Denies any chest pain, shortness of breath, abdominal pain, nausea or vomiting, diarrhea constipation.   General: Well developed, chronically ill appearing. NAD  HEENT: NCAT,  mucous membranes moist.  Cardiovascular: S1 S2 auscultated, no murmurs, RRR  Respiratory: Diminished breath sounds.   Abdomen: Soft, nontender, nondistended, + bowel sounds  Extremities: warm dry without cyanosis clubbing or edema  Neuro:  AAOx3, nonfocal, slight tremors. Very weak.   Skin: wound noted on posterior right shoulder  Psych: Appropriate mood and affect (mildly flat)  Discharge Instructions Discharge Instructions    Discharge instructions    Complete by:  As directed    Patient will be discharged to [facility] skilled nursing facility. Continue physical and occupational therapy.  Patient will need to follow up with primary care provider within one week of discharge, repeat BMP.  Follow up with urology, Dr. Alyson Ingles, in 1-2 weeks. Follow up with cardiology, Dr. Percival Spanish. Patient should continue medications as prescribed.  Patient should follow a heart healthy diet.     Current Discharge Medication List    START taking these medications   Details  hydrALAZINE (APRESOLINE) 25 MG tablet Take 1 tablet (25 mg total) by mouth every 8 (eight) hours. Qty: 90 tablet, Refills: 0    polyethylene glycol (MIRALAX / GLYCOLAX) packet Take 17 g by mouth daily. Qty: 14 each, Refills: 0    tamsulosin (FLOMAX) 0.4 MG CAPS capsule Take 1 capsule (0.4 mg total) by mouth daily. Qty: 30 capsule, Refills: 0      CONTINUE these medications which have NOT CHANGED   Details  amLODipine (NORVASC) 10 MG tablet TAKE 1 TABLET EVERY DAY Qty: 90 tablet, Refills: 3    aspirin 81 MG tablet Take 1 tab daily    Levothyroxine Sodium 25 MCG CAPS Take 1 capsule (25 mcg total) by mouth daily before breakfast. Qty: 90 capsule, Refills: 1   Associated Diagnoses: Hypothyroidism, unspecified type    multivitamin-lutein (OCUVITE-LUTEIN) CAPS capsule Take 1 capsule by mouth daily.    pravastatin (PRAVACHOL) 80 MG tablet Take 1 tablet (80 mg total) by mouth daily. Qty: 60 tablet, Refills: 10      STOP taking  these medications     losartan-hydrochlorothiazide (HYZAAR) 100-12.5 MG tablet        Allergies  Allergen Reactions  . Atenolol     REACTION: bradycardia   Follow-up Information    Eulas Post, MD. Schedule an appointment  as soon as possible for a visit in 1 week(s).   Specialty:  Family Medicine Why:  Hospital follow up Contact information: [place] [address] [phone]        Nicolette Bang, MD. Schedule an appointment as soon as possible for a visit in 1 week(s).   Specialty:  Urology Why:  Hospital follow up, urinary retention Contact information: [place] [address] [phone]        Minus Breeding, MD. Schedule an appointment as soon as possible for a visit in 1 week(s).   Specialty:  Cardiology Why:  Hospital follow up Contact information: [place] [address] [phone]            The results of significant diagnostics from this hospitalization (including imaging, microbiology, ancillary and laboratory) are listed below for reference.    Significant Diagnostic Studies: Ct Abdomen Pelvis Wo Contrast  Result Date: 04/18/2016 CLINICAL DATA:  Hydronephrosis and weakness. Acute on chronic kidney disease. EXAM: CT ABDOMEN AND PELVIS WITHOUT CONTRAST TECHNIQUE: Multidetector CT imaging of the abdomen and pelvis was performed following the standard protocol without IV contrast. COMPARISON:  Ultrasound 04/17/2016 FINDINGS: Lower chest: Minimal pleural nodularity along the right minor fissure on sequence 7, image 5 measuring roughly 3 mm. There are multiple punctate nodules at the lung bases, largest measuring up to 4 mm. No significant pleural effusions. Coronary artery calcifications. Hepatobiliary: Normal appearance of the liver and gallbladder. No significant biliary dilatation. Pancreas: Normal appearance of the pancreas without inflammation or duct dilatation. Spleen: Normal appearance of spleen without enlargement. Adrenals/Urinary Tract: 2.1 cm low-density nodule in the left adrenal gland. Hounsfield units are -7 and this is compatible with a benign adenoma. There appears to be a small adenoma in the right  adrenal gland that measures approximately 1.6 cm. There are bilateral cysts. The largest cyst is in the left kidney interpolar region measuring up to 7.6 cm. There is 1 small hyperdense exophytic structure along the right kidney lower pole measuring 0.6 cm and this is indeterminate. Mild perinephric stranding bilaterally but no significant hydronephrosis. Urinary bladder is completely decompressed with a Foley catheter. There is gas within the urinary bladder and likely iatrogenic from the catheter placement. Minimal fullness of left renal pelvis. No evidence for kidney stones or ureter stones. Stomach/Bowel: Normal appearance of the stomach and duodenum. There is no evidence for bowel dilatation, obstruction or a focal bowel inflammation. Vascular/Lymphatic: Atherosclerotic calcifications involving the aorta and iliac arteries without aneurysm. Small bilateral lymph nodes along the iliac nodal chains bilaterally. Small lymph nodes in the periaortic region. Overall, there is not significant lymph node enlargement. Reproductive: Calcifications in the prostate. No gross abnormality to the prostate or seminal vesicles. Other: Subcutaneous edema. Some edema in the lower pelvis. There is no significant abdominal or pelvic ascites. No evidence for free air. Subcutaneous gas in the left lower abdomen probably from an injection site. Musculoskeletal: Degenerative changes in the hips, right side greater the left. IMPRESSION: The mild hydronephrosis has resolved since the decompression of the urinary bladder with a Foley catheter. Currently, there is no significant hydronephrosis. Bilateral renal cysts. There is a small hyperdense indeterminate structure in the right kidney  lower pole that measures 0.6 cm and this is too small to definitively characterize. Bilateral adrenal adenomas. Multiple tiny pulmonary nodules at the lung bases. Findings are nonspecific. No follow-up needed if patient is low-risk (and has no known or  suspected primary neoplasm). Non-contrast chest CT can be considered in 12 months if patient is high-risk. This recommendation follows the consensus statement: Guidelines for Management of Incidental Pulmonary Nodules Detected on CT Images: From the Fleischner Society 2017; Radiology 2017; 284:228-243. Electronically Signed   By: Markus Daft M.D.   On: 04/18/2016 10:56   Dg Chest 2 View  Result Date: 04/11/2016 CLINICAL DATA:  Fatigue EXAM: CHEST  2 VIEW COMPARISON:  None. FINDINGS: Mild cardiomegaly. No confluent airspace opacities or effusions. No edema. No acute bony abnormality. Surgical changes in the proximal left humerus. IMPRESSION: Cardiomegaly.  No active disease. Electronically Signed   By: Rolm Baptise M.D.   On: 04/11/2016 17:31   Mr Thoracic Spine Wo Contrast  Result Date: 04/12/2016 CLINICAL DATA:  Generalized weakness.  Leukocytosis.  Sepsis. EXAM: MRI THORACIC SPINE WITHOUT CONTRAST TECHNIQUE: Multiplanar, multisequence MR imaging of the thoracic spine was performed. No intravenous contrast was administered. COMPARISON:  Lumbar study same day FINDINGS: Alignment:  Normal Vertebrae: Gentle kyphotic curvature throughout the thoracic region. Old minor anterior compression at T2, 20%. Cord:  No cord compression or primary cord lesion. Paraspinal and other soft tissues: Negative. Aortic atherosclerosis. Disc levels: No disc herniation. Minor chronic disc degenerative changes. No stenosis of the central canal or neural foramina. Solid bridging osteophytes throughout the mid and lower thoracic region. IMPRESSION: No cord lesion. No cord compression. No evidence of spinal infection. Electronically Signed   By: Nelson Chimes M.D.   On: 04/12/2016 13:09   Mr Lumbar Spine Wo Contrast  Result Date: 04/12/2016 CLINICAL DATA:  Profound generalized weakness. Leukocytosis with possible sepsis. EXAM: MRI LUMBAR SPINE WITHOUT CONTRAST TECHNIQUE: Multiplanar, multisequence MR imaging of the lumbar spine was  performed. No intravenous contrast was administered. COMPARISON:  None. FINDINGS: Segmentation:  5 lumbar type vertebral bodies. Alignment:  2 mm anterolisthesis L4-5. Vertebrae:  No fracture or primary bone lesion. Conus medullaris: Extends to the L1-2 level and appears normal. Paraspinal and other soft tissues: Right renal cyst. No other finding. Disc levels: L1-2: Anterior osteophytes. No disc bulge or herniation. No stenosis. L2-3: Inferior right foraminal disc protrusion. This does not appear to cause neural compression. No central canal stenosis. L3-4: Mild bulging of the disc. Mild facet hypertrophy. No compressive stenosis. L4-5: Chronic disc degeneration with endplate osteophytes and circumferential protrusion of the disc. Facet and ligamentous hypertrophy. Stenosis of both lateral recesses and neural foramina. Neural compression could occur at this level, particularly in the lateral recesses. L5-S1: Some transitional features. No disc pathology. No stenosis. IMPRESSION: L4-5: Chronic disc degeneration with endplate osteophytes and shallow protrusion of the disc. Bilateral facet and ligamentous hypertrophy. Stenosis of the lateral recesses that could possibly affect either or both L5 nerve roots. Mild to moderate foraminal narrowing as well without definite compression of the exiting L4 nerve roots. L2-3: Inferior foraminal disc protrusion on the right without apparent compressive effect upon the exiting L2 nerve root. Electronically Signed   By: Nelson Chimes M.D.   On: 04/12/2016 13:06   US Renal  Result Date: 04/17/2016 CLINICAL DATA:  Acute renal insufficiency, hypertension EXAM: RENAL / URINARY TRACT ULTRASOUND COMPLETE COMPARISON:  None. FINDINGS: Right Kidney: Length: 11.1 cm. There is mild fullness of the pelvocaliceal system with some  cortical thinning present. There is slightly echogenic renal parenchyma suggesting chronic renal medical disease. A cyst is noted in the upper medial right kidney  of 3.5 x 2.8 x 3.9 cm. Left Kidney: Length: 12.4 cm. There is slight fullness of the pelvocaliceal system with some cortical thinning. The echogenicity of the renal parenchyma is increased consistent with chronic renal medical disease. Two cysts are present one of 7.0 x 7.0 x 6.4 cm, and the second of 3.7 x 2.7 x 0.3 cm. No solid renal lesion is seen. Bladder: The urinary bladder is urine distended but no abnormality is evident. IMPRESSION: 1. Mild bilateral hydronephrosis and cortical thinning. Consider CT of the abdomen pelvis to assess etiology. 2. Echogenic renal parenchyma consistent with chronic renal medical disease. 3. Simple appearing bilateral renal cysts. Electronically Signed   By: Ivar Drape M.D.   On: 04/17/2016 11:59   Dg Knee Complete 4 Views Left  Result Date: 04/11/2016 CLINICAL DATA:  Bilateral knee pain after fall several weeks ago. EXAM: LEFT KNEE - COMPLETE 4+ VIEW COMPARISON:  None. FINDINGS: No definite fracture or dislocation is noted. Moderate suprapatellar joint effusion is noted. Spurring of patella is noted. Joint spaces appear to be intact. IMPRESSION: Moderate suprapatellar joint effusion. No definite evidence of fracture or dislocation. Electronically Signed   By: Marijo Conception, M.D.   On: 04/11/2016 13:51   Dg Knee Complete 4 Views Right  Result Date: 04/11/2016 CLINICAL DATA:  Bilateral knee pain after fall several weeks ago. EXAM: RIGHT KNEE - COMPLETE 4+ VIEW COMPARISON:  None. FINDINGS: No fracture or dislocation is noted. Moderate suprapatellar joint effusion is noted. Moderate narrowing of patellofemoral space is noted with patella spurring. Mild narrowing of medial joint space is noted. IMPRESSION: Mild degenerative joint disease is noted medially. 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 Moderate suprapatellar joint effusion is noted. No definite fracture or dislocation is noted. Electronically Signed   By: Marijo Conception, M.D.   On: 04/11/2016  13:53    Microbiology: Recent Results (from the past 240 hour(s))  Urine culture     Status: None   Collection Time: 04/11/16  5:35 PM  Result Value Ref Range Status   Specimen Description URINE, RANDOM  Final   Special Requests NONE  Final   Culture NO GROWTH Performed at [facility]   Final   Report Status 04/13/2016 FINAL  Final  Blood culture (routine x 2)     Status: None   Collection Time: 04/11/16  5:46 PM  Result Value Ref Range Status   Specimen Description BLOOD LEFT WRIST  Final   Special Requests BOTTLES DRAWN AEROBIC AND ANAEROBIC 5CC  Final   Culture   Final    NO GROWTH 5 DAYS Performed at [facility]    Report Status 04/16/2016 FINAL  Final  Blood culture (routine x 2)     Status: None   Collection Time: 04/11/16  5:56 PM  Result Value Ref Range Status   Specimen Description BLOOD LEFT ANTECUBITAL  Final   Special Requests BOTTLES DRAWN AEROBIC AND ANAEROBIC 5CC  Final   Culture   Final    NO GROWTH 5 DAYS Performed at [facility]    Report Status 04/16/2016 FINAL  Final  MRSA PCR Screening     Status: None   Collection Time: 04/12/16  1:23 AM  Result Value Ref Range Status   MRSA by PCR NEGATIVE NEGATIVE Final    Comment:  The GeneXpert MRSA Assay (FDA approved for NASAL specimens only), is one component of a comprehensive MRSA colonization surveillance program. It is not intended to diagnose MRSA infection nor to guide or monitor treatment for MRSA infections.      Labs: Basic Metabolic Panel:  Recent Labs Lab 04/15/16 0623 04/16/16 0640 04/17/16 0619 04/18/16 0552 04/19/16 0522 04/20/16 0549  NA 140 140 136 135 136 133*  K 3.9 3.8 4.0 3.7 3.4* 3.4*  CL 111 110 108 108 109 108  CO2 14* 17* 15* 14* 15* 15*  GLUCOSE 167* 140* 161* 124* 129* 141*  BUN 106* 124* 127* 142* 129* 110*  CREATININE 3.82* 4.04* 4.96* 4.95* 4.24* 3.70*  CALCIUM 8.2* 8.1* 8.0* 7.9* 7.8* 7.8*  MG 2.6*  --   --   --   --   --     Liver Function Tests: No results for input(s): AST, ALT, ALKPHOS, BILITOT, PROT, ALBUMIN in the last 168 hours. No results for input(s): LIPASE, AMYLASE in the last 168 hours. No results for input(s): AMMONIA in the last 168 hours. CBC:  Recent Labs Lab 04/18/16 0552 04/19/16 0522  WBC 13.2* 11.3*  HGB 11.6* 10.7*  HCT 33.6* 31.1*  MCV 87.3 85.7  PLT 457* 392   Cardiac Enzymes:  Recent Labs Lab 04/15/16 0623  TROPONINI 0.04*   BNP: BNP (last 3 results) No results for input(s): BNP in the last 8760 hours.  ProBNP (last 3 results) No results for input(s): PROBNP in the last 8760 hours.  CBG: No results for input(s): GLUCAP in the last 168 hours.     SignedCristal Ford  [facility] 04/20/2016, 11:18 AM

## 2016-04-22 DIAGNOSIS — N189 Chronic kidney disease, unspecified: Secondary | ICD-10-CM | POA: Diagnosis not present

## 2016-04-22 DIAGNOSIS — E039 Hypothyroidism, unspecified: Secondary | ICD-10-CM | POA: Diagnosis not present

## 2016-04-22 DIAGNOSIS — N179 Acute kidney failure, unspecified: Secondary | ICD-10-CM | POA: Diagnosis not present

## 2016-04-22 DIAGNOSIS — N1330 Unspecified hydronephrosis: Secondary | ICD-10-CM | POA: Diagnosis not present

## 2016-04-22 DIAGNOSIS — I10 Essential (primary) hypertension: Secondary | ICD-10-CM | POA: Diagnosis not present

## 2016-04-23 ENCOUNTER — Inpatient Hospital Stay (HOSPITAL_COMMUNITY)
Admission: EM | Admit: 2016-04-23 | Discharge: 2016-05-09 | DRG: 853 | Disposition: E | Payer: Medicare HMO | Attending: Internal Medicine | Admitting: Internal Medicine

## 2016-04-23 ENCOUNTER — Emergency Department (HOSPITAL_COMMUNITY): Payer: Medicare HMO

## 2016-04-23 ENCOUNTER — Inpatient Hospital Stay (HOSPITAL_COMMUNITY): Payer: Medicare HMO

## 2016-04-23 ENCOUNTER — Encounter (HOSPITAL_COMMUNITY): Payer: Self-pay | Admitting: Emergency Medicine

## 2016-04-23 DIAGNOSIS — J690 Pneumonitis due to inhalation of food and vomit: Secondary | ICD-10-CM | POA: Diagnosis present

## 2016-04-23 DIAGNOSIS — N184 Chronic kidney disease, stage 4 (severe): Secondary | ICD-10-CM | POA: Diagnosis not present

## 2016-04-23 DIAGNOSIS — G9340 Encephalopathy, unspecified: Secondary | ICD-10-CM | POA: Diagnosis present

## 2016-04-23 DIAGNOSIS — I10 Essential (primary) hypertension: Secondary | ICD-10-CM | POA: Diagnosis not present

## 2016-04-23 DIAGNOSIS — B961 Klebsiella pneumoniae [K. pneumoniae] as the cause of diseases classified elsewhere: Secondary | ICD-10-CM | POA: Diagnosis present

## 2016-04-23 DIAGNOSIS — N1330 Unspecified hydronephrosis: Secondary | ICD-10-CM | POA: Diagnosis not present

## 2016-04-23 DIAGNOSIS — E039 Hypothyroidism, unspecified: Secondary | ICD-10-CM | POA: Diagnosis present

## 2016-04-23 DIAGNOSIS — Z7982 Long term (current) use of aspirin: Secondary | ICD-10-CM | POA: Diagnosis not present

## 2016-04-23 DIAGNOSIS — K921 Melena: Secondary | ICD-10-CM | POA: Diagnosis present

## 2016-04-23 DIAGNOSIS — N390 Urinary tract infection, site not specified: Secondary | ICD-10-CM | POA: Diagnosis present

## 2016-04-23 DIAGNOSIS — N189 Chronic kidney disease, unspecified: Secondary | ICD-10-CM | POA: Diagnosis not present

## 2016-04-23 DIAGNOSIS — Z9861 Coronary angioplasty status: Secondary | ICD-10-CM

## 2016-04-23 DIAGNOSIS — J9601 Acute respiratory failure with hypoxia: Secondary | ICD-10-CM | POA: Diagnosis not present

## 2016-04-23 DIAGNOSIS — A419 Sepsis, unspecified organism: Principal | ICD-10-CM | POA: Diagnosis present

## 2016-04-23 DIAGNOSIS — N138 Other obstructive and reflux uropathy: Secondary | ICD-10-CM | POA: Diagnosis present

## 2016-04-23 DIAGNOSIS — I468 Cardiac arrest due to other underlying condition: Secondary | ICD-10-CM | POA: Diagnosis present

## 2016-04-23 DIAGNOSIS — D72829 Elevated white blood cell count, unspecified: Secondary | ICD-10-CM

## 2016-04-23 DIAGNOSIS — E872 Acidosis: Secondary | ICD-10-CM | POA: Diagnosis present

## 2016-04-23 DIAGNOSIS — E669 Obesity, unspecified: Secondary | ICD-10-CM | POA: Diagnosis present

## 2016-04-23 DIAGNOSIS — D649 Anemia, unspecified: Secondary | ICD-10-CM | POA: Diagnosis present

## 2016-04-23 DIAGNOSIS — R7989 Other specified abnormal findings of blood chemistry: Secondary | ICD-10-CM

## 2016-04-23 DIAGNOSIS — R404 Transient alteration of awareness: Secondary | ICD-10-CM | POA: Diagnosis not present

## 2016-04-23 DIAGNOSIS — R402431 Glasgow coma scale score 3-8, in the field [EMT or ambulance]: Secondary | ICD-10-CM | POA: Diagnosis not present

## 2016-04-23 DIAGNOSIS — N179 Acute kidney failure, unspecified: Secondary | ICD-10-CM | POA: Diagnosis present

## 2016-04-23 DIAGNOSIS — I129 Hypertensive chronic kidney disease with stage 1 through stage 4 chronic kidney disease, or unspecified chronic kidney disease: Secondary | ICD-10-CM | POA: Diagnosis present

## 2016-04-23 DIAGNOSIS — I2510 Atherosclerotic heart disease of native coronary artery without angina pectoris: Secondary | ICD-10-CM | POA: Diagnosis present

## 2016-04-23 DIAGNOSIS — R4020 Unspecified coma: Secondary | ICD-10-CM | POA: Diagnosis not present

## 2016-04-23 DIAGNOSIS — Z452 Encounter for adjustment and management of vascular access device: Secondary | ICD-10-CM | POA: Diagnosis not present

## 2016-04-23 DIAGNOSIS — R6521 Severe sepsis with septic shock: Secondary | ICD-10-CM

## 2016-04-23 DIAGNOSIS — E785 Hyperlipidemia, unspecified: Secondary | ICD-10-CM | POA: Diagnosis present

## 2016-04-23 DIAGNOSIS — J9811 Atelectasis: Secondary | ICD-10-CM | POA: Diagnosis not present

## 2016-04-23 DIAGNOSIS — Z4682 Encounter for fitting and adjustment of non-vascular catheter: Secondary | ICD-10-CM | POA: Diagnosis not present

## 2016-04-23 DIAGNOSIS — I252 Old myocardial infarction: Secondary | ICD-10-CM | POA: Diagnosis not present

## 2016-04-23 DIAGNOSIS — E874 Mixed disorder of acid-base balance: Secondary | ICD-10-CM | POA: Diagnosis not present

## 2016-04-23 DIAGNOSIS — I959 Hypotension, unspecified: Secondary | ICD-10-CM

## 2016-04-23 DIAGNOSIS — Z79899 Other long term (current) drug therapy: Secondary | ICD-10-CM | POA: Diagnosis not present

## 2016-04-23 DIAGNOSIS — Z6829 Body mass index (BMI) 29.0-29.9, adult: Secondary | ICD-10-CM

## 2016-04-23 DIAGNOSIS — Z8249 Family history of ischemic heart disease and other diseases of the circulatory system: Secondary | ICD-10-CM | POA: Diagnosis not present

## 2016-04-23 DIAGNOSIS — R740 Nonspecific elevation of levels of transaminase and lactic acid dehydrogenase [LDH]: Secondary | ICD-10-CM | POA: Diagnosis not present

## 2016-04-23 LAB — BASIC METABOLIC PANEL
Anion gap: 18 — ABNORMAL HIGH (ref 5–15)
BUN: 202 mg/dL — AB (ref 6–20)
CO2: 12 mmol/L — ABNORMAL LOW (ref 22–32)
CREATININE: 5.11 mg/dL — AB (ref 0.61–1.24)
Calcium: 7.5 mg/dL — ABNORMAL LOW (ref 8.9–10.3)
Chloride: 113 mmol/L — ABNORMAL HIGH (ref 101–111)
GFR calc Af Amer: 12 mL/min — ABNORMAL LOW (ref >60)
GFR, EST NON AFRICAN AMERICAN: 10 mL/min — AB (ref >60)
Glucose, Bld: 242 mg/dL — ABNORMAL HIGH (ref 65–99)
Potassium: 5.5 mmol/L — ABNORMAL HIGH (ref 3.5–5.1)
SODIUM: 143 mmol/L (ref 135–145)

## 2016-04-23 LAB — CBC
HCT: 21.7 % — ABNORMAL LOW (ref 39.0–52.0)
Hemoglobin: 7.1 g/dL — ABNORMAL LOW (ref 13.0–17.0)
MCH: 30.2 pg (ref 26.0–34.0)
MCHC: 32.7 g/dL (ref 30.0–36.0)
MCV: 92.3 fL (ref 78.0–100.0)
PLATELETS: 595 10*3/uL — AB (ref 150–400)
RBC: 2.35 MIL/uL — AB (ref 4.22–5.81)
RDW: 15.6 % — AB (ref 11.5–15.5)
WBC: 50 10*3/uL — AB (ref 4.0–10.5)

## 2016-04-23 LAB — POCT I-STAT 3, ART BLOOD GAS (G3+)
ACID-BASE DEFICIT: 9 mmol/L — AB (ref 0.0–2.0)
ACID-BASE DEFICIT: 18 mmol/L — AB (ref 0.0–2.0)
Bicarbonate: 16.9 mmol/L — ABNORMAL LOW (ref 20.0–28.0)
Bicarbonate: 11.1 mmol/L — ABNORMAL LOW (ref 20.0–28.0)
O2 SAT: 97 %
O2 Saturation: 99 %
PH ART: 7.301 — AB (ref 7.350–7.450)
PH ART: 7.059 — AB (ref 7.350–7.450)
PO2 ART: 124 mmHg — AB (ref 83.0–108.0)
TCO2: 18 mmol/L (ref 0–100)
TCO2: 12 mmol/L (ref 0–100)
pCO2 arterial: 33.2 mmHg (ref 32.0–48.0)
pCO2 arterial: 39.2 mmHg (ref 32.0–48.0)
pO2, Arterial: 167 mmHg — ABNORMAL HIGH (ref 83.0–108.0)

## 2016-04-23 LAB — CBC WITH DIFFERENTIAL/PLATELET
BAND NEUTROPHILS: 1 %
BASOS ABS: 0 10*3/uL (ref 0.0–0.1)
BLASTS: 0 %
Basophils Relative: 0 %
EOS ABS: 0 10*3/uL (ref 0.0–0.7)
Eosinophils Relative: 0 %
HEMATOCRIT: 22.7 % — AB (ref 39.0–52.0)
HEMOGLOBIN: 7.4 g/dL — AB (ref 13.0–17.0)
Lymphocytes Relative: 3 %
Lymphs Abs: 1.2 10*3/uL (ref 0.7–4.0)
MCH: 29.8 pg (ref 26.0–34.0)
MCHC: 32.6 g/dL (ref 30.0–36.0)
MCV: 91.5 fL (ref 78.0–100.0)
METAMYELOCYTES PCT: 0 %
MONOS PCT: 4 %
MYELOCYTES: 0 %
Monocytes Absolute: 1.6 10*3/uL — ABNORMAL HIGH (ref 0.1–1.0)
NEUTROS ABS: 36.1 10*3/uL — AB (ref 1.7–7.7)
Neutrophils Relative %: 92 %
Other: 0 %
PROMYELOCYTES ABS: 0 %
Platelets: 600 10*3/uL — ABNORMAL HIGH (ref 150–400)
RBC: 2.48 MIL/uL — AB (ref 4.22–5.81)
RDW: 15.2 % (ref 11.5–15.5)
WBC: 38.9 10*3/uL — AB (ref 4.0–10.5)
nRBC: 0 /100 WBC

## 2016-04-23 LAB — I-STAT ARTERIAL BLOOD GAS, ED
Acid-base deficit: 17 mmol/L — ABNORMAL HIGH (ref 0.0–2.0)
Acid-base deficit: 17 mmol/L — ABNORMAL HIGH (ref 0.0–2.0)
BICARBONATE: 9.1 mmol/L — AB (ref 20.0–28.0)
Bicarbonate: 9.1 mmol/L — ABNORMAL LOW (ref 20.0–28.0)
O2 SAT: 82 %
O2 Saturation: 82 %
PCO2 ART: 23.4 mmHg — AB (ref 32.0–48.0)
PO2 ART: 55 mmHg — AB (ref 83.0–108.0)
TCO2: 10 mmol/L (ref 0–100)
TCO2: 10 mmol/L (ref 0–100)
pCO2 arterial: 23.4 mmHg — ABNORMAL LOW (ref 32.0–48.0)
pH, Arterial: 7.196 — CL (ref 7.350–7.450)
pH, Arterial: 7.196 — CL (ref 7.350–7.450)
pO2, Arterial: 55 mmHg — ABNORMAL LOW (ref 83.0–108.0)

## 2016-04-23 LAB — PROCALCITONIN: Procalcitonin: 5.16 ng/mL

## 2016-04-23 LAB — OCCULT BLOOD X 1 CARD TO LAB, STOOL: Fecal Occult Bld: POSITIVE — AB

## 2016-04-23 LAB — LACTIC ACID, PLASMA: Lactic Acid, Venous: 2.5 mmol/L (ref 0.5–1.9)

## 2016-04-23 LAB — URINALYSIS, ROUTINE W REFLEX MICROSCOPIC
BILIRUBIN URINE: NEGATIVE
GLUCOSE, UA: NEGATIVE mg/dL
KETONES UR: NEGATIVE mg/dL
NITRITE: NEGATIVE
PH: 5 (ref 5.0–8.0)
PROTEIN: 30 mg/dL — AB
Specific Gravity, Urine: 1.025 (ref 1.005–1.030)

## 2016-04-23 LAB — COMPREHENSIVE METABOLIC PANEL
ALBUMIN: 1.6 g/dL — AB (ref 3.5–5.0)
ALK PHOS: 192 U/L — AB (ref 38–126)
ALT: 145 U/L — AB (ref 17–63)
ANION GAP: 17 — AB (ref 5–15)
AST: 82 U/L — AB (ref 15–41)
BILIRUBIN TOTAL: 0.7 mg/dL (ref 0.3–1.2)
BUN: 213 mg/dL — AB (ref 6–20)
CALCIUM: 8.2 mg/dL — AB (ref 8.9–10.3)
CO2: 9 mmol/L — AB (ref 22–32)
Chloride: 116 mmol/L — ABNORMAL HIGH (ref 101–111)
Creatinine, Ser: 5.21 mg/dL — ABNORMAL HIGH (ref 0.61–1.24)
GFR calc Af Amer: 11 mL/min — ABNORMAL LOW (ref >60)
GFR calc non Af Amer: 10 mL/min — ABNORMAL LOW (ref >60)
GLUCOSE: 202 mg/dL — AB (ref 65–99)
Potassium: 5.3 mmol/L — ABNORMAL HIGH (ref 3.5–5.1)
SODIUM: 142 mmol/L (ref 135–145)
TOTAL PROTEIN: 4.5 g/dL — AB (ref 6.5–8.1)

## 2016-04-23 LAB — PREPARE RBC (CROSSMATCH)

## 2016-04-23 LAB — URINALYSIS, MICROSCOPIC (REFLEX)

## 2016-04-23 LAB — COOXEMETRY PANEL
CARBOXYHEMOGLOBIN: 1.1 % (ref 0.5–1.5)
Methemoglobin: 1.5 % (ref 0.0–1.5)
O2 Saturation: 61 %
TOTAL HEMOGLOBIN: 6.6 g/dL — AB (ref 12.0–16.0)

## 2016-04-23 LAB — I-STAT CG4 LACTIC ACID, ED
LACTIC ACID, VENOUS: 5.11 mmol/L — AB (ref 0.5–1.9)
Lactic Acid, Venous: 5.31 mmol/L (ref 0.5–1.9)

## 2016-04-23 LAB — GLUCOSE, CAPILLARY: Glucose-Capillary: 200 mg/dL — ABNORMAL HIGH (ref 65–99)

## 2016-04-23 LAB — I-STAT TROPONIN, ED: Troponin i, poc: 0.05 ng/mL (ref 0.00–0.08)

## 2016-04-23 LAB — CORTISOL: CORTISOL PLASMA: 31 ug/dL

## 2016-04-23 LAB — MRSA PCR SCREENING: MRSA BY PCR: NEGATIVE

## 2016-04-23 LAB — STREP PNEUMONIAE URINARY ANTIGEN: Strep Pneumo Urinary Antigen: NEGATIVE

## 2016-04-23 LAB — ABO/RH: ABO/RH(D): B POS

## 2016-04-23 LAB — TROPONIN I: Troponin I: 0.06 ng/mL (ref <0.03)

## 2016-04-23 MED ORDER — ETOMIDATE 2 MG/ML IV SOLN
INTRAVENOUS | Status: AC
  Filled 2016-04-23: qty 20

## 2016-04-23 MED ORDER — SODIUM BICARBONATE 8.4 % IV SOLN
INTRAVENOUS | Status: DC
  Administered 2016-04-23: 17:00:00 via INTRAVENOUS
  Filled 2016-04-23 (×3): qty 150

## 2016-04-23 MED ORDER — ACETAMINOPHEN 325 MG PO TABS: 650.0000 mg | ORAL_TABLET | ORAL | Status: DC | PRN

## 2016-04-23 MED ORDER — VANCOMYCIN HCL IN DEXTROSE 1-5 GM/200ML-% IV SOLN: 1000.0000 mg | Freq: Once | INTRAVENOUS | Status: DC

## 2016-04-23 MED ORDER — INSULIN ASPART 100 UNIT/ML IV SOLN
10.0000 [IU] | Freq: Once | INTRAVENOUS | Status: AC
  Administered 2016-04-23: 10 [IU] via INTRAVENOUS

## 2016-04-23 MED ORDER — NOREPINEPHRINE BITARTRATE 1 MG/ML IV SOLN
5.0000 ug/min | INTRAVENOUS | Status: DC
  Administered 2016-04-23: 25 ug/min via INTRAVENOUS
  Filled 2016-04-23: qty 4

## 2016-04-23 MED ORDER — CALCIUM CHLORIDE 10 % IV SOLN
1.0000 g | Freq: Once | INTRAVENOUS | Status: AC
  Administered 2016-04-23: 1 g via INTRAVENOUS
  Filled 2016-04-23: qty 10

## 2016-04-23 MED ORDER — VASOPRESSIN 20 UNIT/ML IV SOLN
0.0300 [IU]/min | INTRAVENOUS | Status: DC
  Administered 2016-04-23: 0.03 [IU]/min via INTRAVENOUS
  Filled 2016-04-23: qty 2

## 2016-04-23 MED ORDER — CALCIUM CHLORIDE 10 % IV SOLN
1.0000 g | Freq: Once | INTRAVENOUS | Status: AC
Start: 2016-04-23 — End: 2016-04-23
  Administered 2016-04-23: 1 g via INTRAVENOUS
  Filled 2016-04-23: qty 10

## 2016-04-23 MED ORDER — SODIUM CHLORIDE 0.9 % IV BOLUS (SEPSIS)
500.0000 mL | Freq: Once | INTRAVENOUS | Status: AC
  Administered 2016-04-23: 500 mL via INTRAVENOUS

## 2016-04-23 MED ORDER — DEXTROSE 50 % IV SOLN
25.0000 g | Freq: Once | INTRAVENOUS | Status: AC
  Administered 2016-04-23: 25 g via INTRAVENOUS
  Filled 2016-04-23: qty 50

## 2016-04-23 MED ORDER — FENTANYL CITRATE (PF) 100 MCG/2ML IJ SOLN
INTRAMUSCULAR | Status: AC
  Filled 2016-04-23: qty 4

## 2016-04-23 MED ORDER — PIPERACILLIN-TAZOBACTAM 3.375 G IVPB 30 MIN
3.3750 g | Freq: Once | INTRAVENOUS | Status: AC
  Administered 2016-04-23: 3.375 g via INTRAVENOUS
  Filled 2016-04-23: qty 50

## 2016-04-23 MED ORDER — EPINEPHRINE PF 1 MG/10ML IJ SOSY
1.0000 mg | PREFILLED_SYRINGE | Freq: Once | INTRAMUSCULAR | Status: AC
  Administered 2016-04-23: 1 mg via INTRAVENOUS

## 2016-04-23 MED ORDER — EPINEPHRINE PF 1 MG/10ML IJ SOSY
5.0000 mL | PREFILLED_SYRINGE | Freq: Once | INTRAMUSCULAR | Status: AC
  Administered 2016-04-23: 0.5 mg via INTRAVENOUS

## 2016-04-23 MED ORDER — FAMOTIDINE IN NACL 20-0.9 MG/50ML-% IV SOLN
20.0000 mg | INTRAVENOUS | Status: DC
  Administered 2016-04-23: 20 mg via INTRAVENOUS
  Filled 2016-04-23: qty 50

## 2016-04-23 MED ORDER — HEPARIN SODIUM (PORCINE) 5000 UNIT/ML IJ SOLN
5000.0000 [IU] | Freq: Three times a day (TID) | INTRAMUSCULAR | Status: DC
  Filled 2016-04-23: qty 1

## 2016-04-23 MED ORDER — PROPOFOL 1000 MG/100ML IV EMUL
INTRAVENOUS | Status: AC
  Filled 2016-04-23: qty 100

## 2016-04-23 MED ORDER — DOCUSATE SODIUM 50 MG/5ML PO LIQD: 100.0000 mg | Freq: Two times a day (BID) | ORAL | Status: DC | PRN

## 2016-04-23 MED ORDER — PIPERACILLIN-TAZOBACTAM 4.5 G IVPB: 4.5000 g | Freq: Once | INTRAVENOUS | Status: DC

## 2016-04-23 MED ORDER — ROCURONIUM BROMIDE 50 MG/5ML IV SOLN
INTRAVENOUS | Status: DC | PRN
  Administered 2016-04-23: 50 mg via INTRAVENOUS

## 2016-04-23 MED ORDER — CHLORHEXIDINE GLUCONATE 0.12% ORAL RINSE (MEDLINE KIT): 15.0000 mL | Freq: Two times a day (BID) | OROMUCOSAL | Status: DC

## 2016-04-23 MED ORDER — FENTANYL CITRATE (PF) 100 MCG/2ML IJ SOLN
INTRAMUSCULAR | Status: DC | PRN
  Administered 2016-04-23 (×2): 100 ug via INTRAVENOUS

## 2016-04-23 MED ORDER — ETOMIDATE 2 MG/ML IV SOLN
INTRAVENOUS | Status: DC | PRN
  Administered 2016-04-23: 20 mg via INTRAVENOUS

## 2016-04-23 MED ORDER — SODIUM CHLORIDE 0.9 % IV SOLN: 250.0000 mL | INTRAVENOUS | Status: DC | PRN

## 2016-04-23 MED ORDER — SODIUM CHLORIDE 0.9 % IV BOLUS (SEPSIS)
1000.0000 mL | Freq: Once | INTRAVENOUS | Status: AC
  Administered 2016-04-23: 1000 mL via INTRAVENOUS

## 2016-04-23 MED ORDER — MIDAZOLAM HCL 2 MG/2ML IJ SOLN: 1.0000 mg | INTRAMUSCULAR | Status: DC | PRN

## 2016-04-23 MED ORDER — FENTANYL CITRATE (PF) 100 MCG/2ML IJ SOLN
50.0000 ug | INTRAMUSCULAR | Status: DC | PRN
  Administered 2016-04-23: 50 ug via INTRAVENOUS
  Filled 2016-04-23: qty 2

## 2016-04-23 MED ORDER — NOREPINEPHRINE BITARTRATE 1 MG/ML IV SOLN
0.0000 ug/min | INTRAVENOUS | Status: DC
  Administered 2016-04-23: 5 ug/min via INTRAVENOUS
  Filled 2016-04-23: qty 4

## 2016-04-23 MED ORDER — EPINEPHRINE PF 1 MG/ML IJ SOLN: 1.0000 mg | Freq: Once | INTRAMUSCULAR | Status: DC

## 2016-04-23 MED ORDER — INSULIN ASPART 100 UNIT/ML ~~LOC~~ SOLN: 10.0000 [IU] | Freq: Once | SUBCUTANEOUS | Status: DC

## 2016-04-23 MED ORDER — FENTANYL CITRATE (PF) 100 MCG/2ML IJ SOLN: 50.0000 ug | INTRAMUSCULAR | Status: DC | PRN

## 2016-04-23 MED ORDER — ORAL CARE MOUTH RINSE
15.0000 mL | Freq: Four times a day (QID) | OROMUCOSAL | Status: DC
  Administered 2016-04-23: 15 mL via OROMUCOSAL

## 2016-04-23 MED ORDER — SODIUM BICARBONATE 8.4 % IV SOLN
100.0000 meq | Freq: Once | INTRAVENOUS | Status: AC
  Administered 2016-04-23: 100 meq via INTRAVENOUS

## 2016-04-23 MED ORDER — LEVOTHYROXINE SODIUM 25 MCG PO TABS
25.0000 ug | ORAL_TABLET | Freq: Every day | ORAL | Status: DC
  Filled 2016-04-23: qty 1

## 2016-04-23 MED ORDER — SODIUM BICARBONATE 8.4 % IV SOLN
INTRAVENOUS | Status: AC
  Filled 2016-04-23: qty 100

## 2016-04-23 MED ORDER — NOREPINEPHRINE BITARTRATE 1 MG/ML IV SOLN
5.0000 ug/min | INTRAVENOUS | Status: DC
  Administered 2016-04-23: 50 ug/min via INTRAVENOUS
  Filled 2016-04-23: qty 16

## 2016-04-23 MED ORDER — VANCOMYCIN HCL 10 G IV SOLR
2000.0000 mg | Freq: Once | INTRAVENOUS | Status: AC
  Administered 2016-04-23: 2000 mg via INTRAVENOUS
  Filled 2016-04-23: qty 2000

## 2016-04-23 MED ORDER — SODIUM CHLORIDE 0.9 % IV SOLN: Freq: Once | INTRAVENOUS | Status: DC

## 2016-04-24 LAB — TYPE AND SCREEN
ABO/RH(D): B POS
Antibody Screen: NEGATIVE
UNIT DIVISION: 0

## 2016-04-26 LAB — CULTURE, RESPIRATORY W GRAM STAIN: Special Requests: NORMAL

## 2016-04-26 LAB — CULTURE, RESPIRATORY

## 2016-04-26 LAB — LEGIONELLA PNEUMOPHILA SEROGP 1 UR AG: L. pneumophila Serogp 1 Ur Ag: NEGATIVE

## 2016-04-26 LAB — URINE CULTURE

## 2016-04-28 LAB — CULTURE, BLOOD (ROUTINE X 2)
CULTURE: NO GROWTH
CULTURE: NO GROWTH

## 2016-04-29 ENCOUNTER — Telehealth: Payer: Self-pay

## 2016-04-29 NOTE — Telephone Encounter (Signed)
On 04/29/16 I received a death certificate from [facility] (faxed). The death certificate is for cremation. The patient is a patient of Doctor [name]. The death certificate will be taken to [facility] ([address]) this am for signature.  On 05/27/16 I received the death certificate back from Doctor [name]. I got the death certificate ready and called the funeral home to let them know the death certificate is ready for pickup. I also faxed a copy to the funeral home per the funeral home request.

## 2016-05-01 ENCOUNTER — Telehealth: Payer: Self-pay

## 2016-05-01 NOTE — Telephone Encounter (Signed)
On 2016-05-04 I received a death certificate from [facility] (original). The death certificate is for cremation. The patient is a patient of Doctor [name]. The death certificate will be taken to [facility]) this am for signature.   On 04-May-2016 I received the death certificate back from Doctor [name]. I got the death certificate ready and called the funeral home to let them know the death certificate is ready for pickup. I also faxed a copy to the funeral home per the funeral home request.

## 2016-05-06 MED FILL — Medication: Qty: 1 | Status: AC

## 2016-05-09 NOTE — Progress Notes (Signed)
Co-ox run on pt but unable to result in EPIC. Results hand delivered to P. Heber [place], NP CCM.   ctHB 6.6 sO2 61.0 FO2Hb 59.4 FCOHb 1.1 FMetHb 1.5  RT will continue to monitor.

## 2016-05-09 NOTE — Progress Notes (Addendum)
Cacl 1 gm IV and 2 amps Na Bicarb given IV and 1 mg epi per Dr Bari Mantis orders . Labs sent.Vasopressin started IV.

## 2016-05-09 NOTE — ED Triage Notes (Signed)
Pt here from nursing home with c/o hypotension and aloc more than his usual ,

## 2016-05-09 NOTE — Progress Notes (Signed)
CRITICAL VALUE ALERT  Critical value received:  Lactic Acid 2.5 and Troponin 0.06  Date of notification:  2016-04-29  Time of notification:  1906/1936  Critical value read back: Yes  Nurse who received alert: Deboraha Sprang, RN  MD notified (1st page):  Dr. Gareth Morgan  Time of first page:  1945

## 2016-05-09 NOTE — Code Documentation (Signed)
IM Resident Code team arrived to [employer]. Primary Critical Care team at bedside and indicated they will be running the Code. We are available for assistance if needed.   Alphonzo Grieve, MD IMTS - PGY1 Pager [phone]

## 2016-05-09 NOTE — H&P (Signed)
Name: Glen [name] MRN: [id number] DOB: [birth year]    LOS: 0  PCCM ADMISSION NOTE  History of Present Illness: Glen [name] is a 11 M with PMHx of CKD IV (baseline creatinine 2.3 in November 2017), HTN, CAD, Obesity, and Hypothyroidism who was recently admitted from 1/4 to 1/13 with weakness and found to have acute on chronic CKD 2/2 bladder outlet obstruction with improved after foley placement. Glen [name] did have an elevated WBC during admission which resolved without addition of antibiotics. Blood cultures and urine culture were negative from admission. Glen [name] was to follow up with Urology as outpatient, no obvious findings on CT for obstruction. Glen [name] was discharge to [facility] on 1/13. Per wife, Glen [name] has been very somnolent since discharge, but otherwise eating and talking; however, she is a poor historian.   Today, Glen [name] was found to be less responsive and hypotensive at the SNF and EMS was called. Glen [name] received a 500 cc bolus with EMS and hypertensive on arrival in the 170s/100s. However, Glen [name]'s BP continued to drop during admission to 60/40s despite receiving 3.5 L of IVF. Glen [name] was also noted to have leukocytosis of 38.9 (11 on discharge), anemic at 7.4 (10.7 on discharge), and in acute on chronic CKD with creatinine 5.21, BUN 213 (3.7, 110 on discharge, respectively). ABG showed pH of 7.196, pCO2 23.4, pO2 55 with bicarb of 9 consistent with a non-gap metabolic acidosis with an additional respiratory acidosis.   Glen [name] is somnolent and unarousable on exam. Per wife, Glen [name] has not had any recent complaints other than fatigue. She denies any complaints of fever, chills, chest pain, shortness of breath, nausea, vomiting, diarrhea, abdominal pain, swelling, painful urination. Glen [name] has been more somnolent and confused.   Lines / Drains: CVL 1/16 >> ETT 1/16 >>  Cultures: BCx 1/16 >> UCx 1/16 >> Tracheal Aspirate 1/16 >>  Antibiotics: Vancomycin 1/16 >> Zosyn 1/16  >>  Tests / Events: CXR 1/16 >>Probable mild linear atelectasis at the left lung base. Consider followup if symptoms persist or worsen. Stable mild cardiomegaly. CXR 1/16 >> Endotracheal tube noted in good anatomic position. Left lower lobe atelectasis and infiltrate with left-sided pleural effusion.   Past Medical History:  Diagnosis Date  . Bradycardia    requiring discontinuation of b-blocker  . CAD S/P percutaneous coronary angioplasty 2004   s/p MI, s/p stents LCX and prox LAD; Myoview 01/2016: non-ischemic.  EF ~47%. Lateral, inferolateral Infarct c/w prior Cx MI  . Chronic kidney disease    renal failure  . Hx of colonoscopy 05/15/2005  . Hyperlipidemia   . Hypertension   . Myocardial infarction    08/02/2002  . Obesity   . Renal insufficiency    Past Surgical History:  Procedure Laterality Date  . CARDIAC CATHETERIZATION    . CARDIAC CATHETERIZATION  2004   2 stents placed  . COLONOSCOPY    . POLYPECTOMY     Prior to Admission medications   Medication Sig Start Date End Date Taking? Authorizing Provider  amLODipine (NORVASC) 10 MG tablet TAKE 1 TABLET EVERY DAY Glen [name] taking differently: TAKE 10 MG BY MOUTH EVERY DAY 08/07/15   Eulas Post, MD  aspirin 81 MG tablet Take 1 tab daily Glen [name] taking differently: Take 81 mg by mouth daily. Take 1 tab daily 08/03/10   Jolaine Artist, MD  hydrALAZINE (APRESOLINE) 25 MG tablet Take 1 tablet (25 mg total) by mouth every 8 (eight) hours. 04/20/16   Maryann Mikhail, DO  Levothyroxine Sodium 25 MCG  CAPS Take 1 capsule (25 mcg total) by mouth daily before breakfast. 02/22/16   Eulas Post, MD  multivitamin-lutein (OCUVITE-LUTEIN) CAPS capsule Take 1 capsule by mouth daily.    Historical Provider, MD  polyethylene glycol (MIRALAX / GLYCOLAX) packet Take 17 g by mouth daily. 04/21/16   Maryann Mikhail, DO  pravastatin (PRAVACHOL) 80 MG tablet Take 1 tablet (80 mg total) by mouth daily. 01/31/16   Minus Breeding, MD   tamsulosin (FLOMAX) 0.4 MG CAPS capsule Take 1 capsule (0.4 mg total) by mouth daily. 04/21/16   Maryann Mikhail, DO   Allergies Allergies  Allergen Reactions  . Atenolol Other (See Comments)    REACTION: bradycardia    Family History Family History  Problem Relation Age of Onset  . Aneurysm Father     brain  . Hypertension Father   . Heart attack Brother   . Alcohol abuse Brother     Social History  reports that Glen [name] has never smoked. Glen [name] has never used smokeless tobacco. Glen [name] reports that Glen [name] drinks about 0.6 oz of alcohol per week . Glen [name] reports that Glen [name] does not use drugs.  Review Of Systems  11 points review of systems is negative with an exception of listed in HPI.  Physical Examination: Vitals:   05-12-16 1445 05-12-16 1447 05/12/2016 1500 12-May-2016 1515  BP: 95/55  (!) 84/51 (!) 78/50  Pulse: 77  76 77  Resp: 23  26 26   Temp:      TempSrc:      SpO2: 91% 100% 100% 100%  Height:  6\' 2"  (1.88 m)     General: Vital signs reviewed.  Glen [name] is chronically ill appearing, unarousable on exam  Head: Normocephalic and atraumatic. Eyes: pupils sluggish to react bilaterally, no scleral icterus.  Neck: Supple, trachea midline Cardiovascular: RRR, S1 normal, S2 normal, no murmurs, gallops, or rubs. Pulmonary/Chest: Diffusely rhonchorus, sonorous on exam. No wheezes. On intubation and bronch, Glen [name] has thick, malodorous secretions concerning for aspiration Abdominal: Soft, non-tender, mildly distended, hypoactive BS + Extremities: No lower extremity edema bilaterally Neurological: GCS 7 Skin: Warm, dry and intact. No rashes or erythema. Psychiatric: Unable to assess  Ventilator settings: Vent Mode: PRVC FiO2 (%):  [100 %] 100 % Set Rate:  [26 bmp] 26 bmp Vt Set:  [id number] mL] 660 mL PEEP:  [5 cmH20] 5 cmH20 Plateau Pressure:  [16 cmH20] 16 cmH20  Labs and Imaging:  Reviewed.  Please refer to the Assessment and Plan section for relevant results.  Assessment and Plan: Mr.  [name] is a 20 M presenting with lethargy and hypotension concerning for septic shock secondary to aspiration pneumonia versus UTI and acute encephalopathy secondary to metabolic acidosis, sepsis and uremia.  PULMONARY A: Acute Hypoxic Respiratory Failure 2/2 Acute Encephalopathy Non-Anion Gap Metabolic Acidosis with Respiratory Acidosis Aspiration Pneumonia P:   Intubated Vent Protocol On Vanc/Zosyn for Aspiration Pneumonia CXR post intubation Repeat CXR tomorrow am Tracheal Aspirate 1/16 >> pending ABG tomorrow am Procalcitonin, strep pneumo, legionella Fentanyl and Versed prn   CARDIOVASCULAR A:  Hypotension 2/2 Septic Shock History of CAD History of Hypertension P:  Continue Levophed  CVL 1/16 >> Telemetry  RENAL A:   Acute on Chronic CKD IV: Baseline creatinine 2.3, now 5.21 on admission with BUN 214, no prior discussions of HD Obstructive Nephropathy: Urology recommended keeping foley in place, not draining, will replace, may need repeat renal US Possible UTI Lactic Acidosis P:   Change foley Vanc/Zosyn for possible UTI UA pending UCx 1/16 >>  pending Monitor renal function Bicarb infusion 125 cc/hr May need renal consult at some point   GASTROINTESTINAL A:   No acute issues P:   Famotidine BID NPO Colace and Senokot prn Place G tube  HEMATOLOGIC A:   Acute on Chronic Normocytic Anemia: Recent Hgb on discharge 10, now 7.4, no evidence of bleeding Leukocytosis P:  Heparin SQ TID for ppx  INFECTIOUS A:   Septic Shock 2/2 likely Aspiration Pneumonia versus UTI Lactic Acidosis P:   On Vanc/Zosyn 1/16 >> Trending lactic acid Trend CBC On Sodium Bicarb 125 cc/hr  On Levophed   ENDOCRINE A:   Hypothyroidism P:   Continue levothyroxine 25 mcg daily Check TSH tomorrow am  NEUROLOGIC A:   Acute Encephalopathy 2/2 Uremia, Metabolic Acidosis and Sepsis P:   Intubated for airway protection Daily WUA and SBT Fentanyl and Versed prn    The Glen [name] is critically ill with multiple organ systems failure and requires high complexity decision making for assessment and support, frequent evaluation and titration of therapies, application of advanced monitoring technologies and extensive interpretation of multiple databases. Critical Care Time devoted to Glen [name] care services described in this note is 50 minutes.  Martyn Malay, DO PGY-3 Internal Medicine Resident Pager # [phone] 2016/05/05 3:50 PM   STAFF NOTE: Linwood Dibbles, MD FACP have personally reviewed Glen [name]'s available data, including medical history, events of note, physical examination and test results as part of my evaluation. I have discussed with resident/NP and other care providers such as pharmacist, RN and RRT. In addition, I personally evaluated Glen [name] and elicited key findings of:  In ER room, not responsive, major gurgling secretion sin neck, severe distress, severe ronchi diffuse, requires emergent intubation, lactic noted, septic shock, sepsis protocol done, 30 cc/kg, repeat lactic required, continued volume resuscitation, source appears to be HCAP / asp, use Vosyn, sending sputum, place cvp, to goal 12, levophed to map goal, likely requires levophed, use bicarb for NONAG, follow urine output, Glen [name] required emergent bronch as pus was so thick and obstructing the ETT, may need peep, increase MV on vent and repeat abg, I updated wife and son in full in room The Glen [name] is critically ill with multiple organ systems failure and requires high complexity decision making for assessment and support, frequent evaluation and titration of therapies, application of advanced monitoring technologies and extensive interpretation of multiple databases.   Critical Care Time devoted to Glen [name] care services described in this note is 23 Minutes. This time reflects time of care of this signee: Merrie Roof, MD FACP. This critical care time does not reflect procedure time, or  teaching time or supervisory time of PA/NP/Med student/Med Resident etc but could involve care discussion time. Rest per NP/medical resident whose note is outlined above and that I agree with   Lavon Paganini. [name], MD, [place] Pgr: [facility] 2016/05/05 5:13 PM

## 2016-05-09 NOTE — Procedures (Signed)
Bronchoscopy Procedure Note Glen [name] [id number] December 12, 1940  Procedure: Bronchoscopy Indications: Diagnostic evaluation of the airways, Obtain specimens for culture and/or other diagnostic studies and Remove secretions  ett occluding  Procedure Details Consent: Unable to obtain consent because of emergent medical necessity. Time Out: Verified patient identification, verified procedure, site/side was marked, verified correct patient position, special equipment/implants available, medications/allergies/relevent history reviewed, required imaging and test results available.  Performed  In preparation for procedure, patient was given 100% FiO2 and bronchoscope lubricated. Sedation: Etomidate  Airway entered and the following bronchi were examined: RUL, RML, RLL, LUL, LLL and Bronchi.   Procedures performed: no Bronchoscope removed.  , Patient placed back on 100% FiO2 at conclusion of procedure.    Evaluation Hemodynamic Status: BP stable throughout; O2 sats: stable throughout Patient's Current Condition: stable Specimens:  Sent purulent fluid Complications: No apparent complications Patient did tolerate procedure well.   Glen [name]. 23-May-2016   1. Massive pus occluding left main and half trachea, suctioned, thick grey pus, impressive 2. BAL done left lingula, yield 100% thick grey pus  3. Cleared secretions as able  Glen [name]. [name], MD, [employer] Pgr: [facility]

## 2016-05-09 NOTE — ED Notes (Signed)
CCM at bedside 

## 2016-05-09 NOTE — Progress Notes (Signed)
RT note:  Arrived in room to find SpO2 65%.  RN and resident at bedside.  FiO2 at 100% and PEEP 10 CmH2O set by resident.  Patient removed from ventilator and bag ventilations assumed by RT with PEEP valve.  Good compliance with bagging.  After slow recruitment patient placed back on ventilator.  SpO2 slowly fell back to low 80% range.  Patient changed to PCV with increase in PEEP to 12 CmH2O.  Pplat maintained around 30.  Resident aware and in agreement.  RT will continue to monitor.

## 2016-05-09 NOTE — Progress Notes (Signed)
Pt asystole on monitor. Heart and lung auscultated, no pulse or lung sounds ausculted. Pt pronounced at [id number] by Deboraha Sprang, RN and Loma Newton RN. Pt's family at bedside.

## 2016-05-09 NOTE — Progress Notes (Signed)
Received patient from ED on vent with labored respirations Elink called for orders .

## 2016-05-09 NOTE — ED Notes (Signed)
MD [name] at bedside bronching patient.

## 2016-05-09 NOTE — Progress Notes (Addendum)
Patient with large old bloody stool .Dr Quay Burow in room and informed.Family in room

## 2016-05-09 NOTE — Progress Notes (Signed)
   2016/04/25 1833  Clinical Encounter Type  Visited With Patient  Visit Type Code  Referral From Other (Comment) ( responded to page)  Consult/Referral To Chaplain  Spiritual Encounters  Spiritual Needs Other (Comment) (ministry of presence 18:33)  Stress Factors  Family Stress Factors None identified;Not reviewed (no family @18 :42)  Pt.code, 18:33,  No family @ 18:40, Chaplain responded to code, ministry of presence.  No spiritual needs accessed at this time.  Chaplain Alexy Bringle A. Nonnie Done ,  MA-PC , BA-REL/PHIL   [phone]

## 2016-05-09 NOTE — Procedures (Signed)
Intubation Procedure Note Glen [name] [id number] [birth year]  Procedure: Intubation Indications: Respiratory insufficiency  Procedure Details Consent: Risks of procedure as well as the alternatives and risks of each were explained to the (patient/caregiver).  Consent for procedure obtained. Time Out: Verified patient identification, verified procedure, site/side was marked, verified correct patient position, special equipment/implants available, medications/allergies/relevent history reviewed, required imaging and test results available.  Performed  Maximum sterile technique was used including cap, gloves, gown and hand hygiene.  MAC and 4    Evaluation Hemodynamic Status: BP stable throughout; O2 sats: stable throughout Patient's Current Condition: stable Complications: No apparent complications Patient did tolerate procedure well. Chest X-ray ordered to verify placement.  CXR: pending.   Glen [name]. 27-Apr-2016   Airway with MASSIVE pus al over and in cords, suctioned as able Occluding ett , for Glen [name]. Glen Mould, MD, [place] Pgr: [facility]

## 2016-05-09 NOTE — Procedures (Signed)
Central Venous Catheter Insertion Procedure Note Glen [name] [id number] Feb 25, 1941  Procedure: Insertion of Central Venous Catheter Indications: Assessment of intravascular volume, Drug and/or fluid administration and Frequent blood sampling  Procedure Details Consent: Risks of procedure as well as the alternatives and risks of each were explained to the (patient/caregiver).  Consent for procedure obtained. Time Out: Verified patient identification, verified procedure, site/side was marked, verified correct patient position, special equipment/implants available, medications/allergies/relevent history reviewed, required imaging and test results available.  Performed  Maximum sterile technique was used including antiseptics, cap, gloves, gown, hand hygiene, mask and sheet. Skin prep: Chlorhexidine; local anesthetic administered A antimicrobial bonded/coated triple lumen catheter was placed in the right internal jugular vein using the Seldinger technique.  Evaluation Blood flow good Complications: No apparent complications Patient did tolerate procedure well. Chest X-ray ordered to verify placement.  CXR: pending.  Raylene Miyamoto 2016-05-14, 5:12 PM  [place] Kasandra Fehr J. [name], MD, [place] Pgr: [facility]

## 2016-05-09 NOTE — ED Notes (Signed)
CCM placing central line. Notified MD [name] of lactic acid of 5.11

## 2016-05-09 NOTE — ED Notes (Signed)
Pt sats noted to be 88% on vent, MD paged.

## 2016-05-09 NOTE — Procedures (Signed)
Arterial Catheter Insertion Procedure Note Glen [name] [id number] [birth year]  Procedure: Insertion of Arterial Catheter  Indications: Blood pressure monitoring and Frequent blood sampling  Procedure Details Consent: Unable to obtain consent because of emergent medical necessity. Time Out: Verified patient identification, verified procedure, site/side was marked, verified correct patient position, special equipment/implants available, medications/allergies/relevent history reviewed, required imaging and test results available.  Performed  Maximum sterile technique was used including antiseptics, cap, gloves, gown, hand hygiene, mask and sheet. Skin prep: Chlorhexidine; local anesthetic administered 20 gauge catheter was inserted into right radial artery using the Seldinger technique.  Evaluation Blood flow good; BP tracing good. Complications: No apparent complications.   Glen [name] 05-13-16

## 2016-05-09 NOTE — ED Provider Notes (Signed)
[facility] DEPT Provider Note   CSN: [id number] Arrival date & time: May 02, 2016  1146     History   Chief Complaint Chief Complaint  Patient presents with  . Altered Mental Status    HPI Glen [name] is a 76 y.o. male.  HPI   A LEVEL 5 CAVEAT PERTAINS DUE TO ALTERED MENTAL STATUS Pt presenting from [facility] with c/o hypotension.  Per EMS nursing home staff states that he was admitted there a few days ago- since his admission his baseline is to be unresponsive- responds only to sternal rub with moaning.  This morning he was noted not to be responsive to sternal rub and was found to be hypotensive.  He arrives via EMS- was given 500cc NS bolus and is hypertensive upon arrival.  He had 99% O2 sat on RA per EMS.    Upon d/w wife and son at bedside wife states that he has been very sleepy but he woke up yesterday and she was able to feed him, she states he then went right back to sleep.    Past Medical History:  Diagnosis Date  . Bradycardia    requiring discontinuation of b-blocker  . CAD S/P percutaneous coronary angioplasty 2004   s/p MI, s/p stents LCX and prox LAD; Myoview 01/2016: non-ischemic.  EF ~47%. Lateral, inferolateral Infarct c/w prior Cx MI  . Chronic kidney disease    renal failure  . Hx of colonoscopy 05/15/2005  . Hyperlipidemia   . Hypertension   . Myocardial infarction    08/02/2002  . Obesity   . Renal insufficiency     Patient Active Problem List   Diagnosis Date Noted  . Septic shock ([place]) 05/02/2016  . Bilateral hydronephrosis 04/18/2016  . AKI (acute kidney injury) ([place]) 04/12/2016  . Acute kidney injury ([place]) 04/11/2016  . Squamous cell skin cancer 03/06/2016  . CKD (chronic kidney disease) stage 4, GFR 15-29 ml/min (HCC) 08/25/2015  . OBESITY 01/25/2009  . Hyperlipidemia 10/28/2006  . Essential hypertension 10/28/2006  . Coronary atherosclerosis 10/28/2006    Past Surgical History:  Procedure Laterality Date  . CARDIAC  CATHETERIZATION    . CARDIAC CATHETERIZATION  2004   2 stents placed  . COLONOSCOPY    . POLYPECTOMY         Home Medications    Prior to Admission medications   Medication Sig Start Date End Date Taking? Authorizing Provider  amLODipine (NORVASC) 10 MG tablet TAKE 1 TABLET EVERY DAY Patient taking differently: TAKE 10 MG BY MOUTH EVERY DAY 08/07/15  Yes Eulas Post, MD  aspirin 81 MG tablet Take 1 tab daily Patient taking differently: Take 81 mg by mouth daily. Take 1 tab daily 08/03/10  Yes Jolaine Artist, MD  hydrALAZINE (APRESOLINE) 25 MG tablet Take 1 tablet (25 mg total) by mouth every 8 (eight) hours. 04/20/16  Yes Maryann Mikhail, DO  Levothyroxine Sodium 25 MCG CAPS Take 1 capsule (25 mcg total) by mouth daily before breakfast. 02/22/16  Yes Eulas Post, MD  miconazole (MICOTIN) 2 % powder Apply 1 application topically 2 (two) times daily.   Yes Historical Provider, MD  multivitamin-lutein (OCUVITE-LUTEIN) CAPS capsule Take 1 capsule by mouth daily.   Yes Historical Provider, MD  polyethylene glycol (MIRALAX / GLYCOLAX) packet Take 17 g by mouth daily. 04/21/16  Yes Maryann Mikhail, DO  pravastatin (PRAVACHOL) 80 MG tablet Take 1 tablet (80 mg total) by mouth daily. Patient taking differently: Take 80 mg by mouth every evening.  01/31/16  Yes Minus Breeding, MD  tamsulosin (FLOMAX) 0.4 MG CAPS capsule Take 1 capsule (0.4 mg total) by mouth daily. Patient taking differently: Take 0.4 mg by mouth daily after supper.  04/21/16  Yes Maryann Mikhail, DO    Family History Family History  Problem Relation Age of Onset  . Aneurysm Father     brain  . Hypertension Father   . Heart attack Brother   . Alcohol abuse Brother     Social History Social History  Substance Use Topics  . Smoking status: Never Smoker  . Smokeless tobacco: Never Used  . Alcohol use 0.6 oz/week    1 Glasses of wine per week     Allergies   Atenolol   Review of Systems Review of  Systems  UNABLE TO OBTAIN ROS DUE TO LEVEL 5 CAVEAT   Physical Exam Updated Vital Signs BP 95/76   Pulse 98   Temp 97.5 F (36.4 C)   Resp (!) 26   Ht 6\' 2"  (1.88 m)   Wt 105.8 kg   SpO2 100%   BMI 29.95 kg/m  Vitals reviewed Physical Exam Physical Examination: General appearance - ill appearing, unresponsive Mental status - somnolent Eyes - pupils equal and reactive,, no scleral icterus, no conjunctival injection Mouth - mucous membranes moist, pharynx normal without lesions Neck - supple, no significant adenopathy Chest - coarse rhonchi bilaterally, no wheezing, protecting his airway Heart - normal rate, regular rhythm, normal S1, S2, no murmurs, rubs, clicks or gallops Abdomen - soft, nontender, nondistended, no masses or organomegaly Neurological - somnolent, not following commands GU- foley catheter in place Extremities - peripheral pulses normal, no pedal edema, no clubbing or cyanosis Skin - normal coloration and turgor, no rashes  ED Treatments / Results  Labs (all labs ordered are listed, but only abnormal results are displayed) Labs Reviewed  URINE CULTURE - Abnormal; Notable for the following:       Result Value   Culture 50,000 COLONIES/mL GRAM NEGATIVE RODS (*)    All other components within normal limits  CBC WITH DIFFERENTIAL/PLATELET - Abnormal; Notable for the following:    WBC 38.9 (*)    RBC 2.48 (*)    Hemoglobin 7.4 (*)    HCT 22.7 (*)    Platelets 600 (*)    Neutro Abs 36.1 (*)    Monocytes Absolute 1.6 (*)    All other components within normal limits  COMPREHENSIVE METABOLIC PANEL - Abnormal; Notable for the following:    Potassium 5.3 (*)    Chloride 116 (*)    CO2 9 (*)    Glucose, Bld 202 (*)    BUN 213 (*)    Creatinine, Ser 5.21 (*)    Calcium 8.2 (*)    Total Protein 4.5 (*)    Albumin 1.6 (*)    AST 82 (*)    ALT 145 (*)    Alkaline Phosphatase 192 (*)    GFR calc non Af Amer 10 (*)    GFR calc Af Amer 11 (*)    Anion gap 17  (*)    All other components within normal limits  URINALYSIS, ROUTINE W REFLEX MICROSCOPIC - Abnormal; Notable for the following:    Hgb urine dipstick LARGE (*)    Protein, ur 30 (*)    Leukocytes, UA LARGE (*)    All other components within normal limits  OCCULT BLOOD X 1 CARD TO LAB, STOOL - Abnormal; Notable for the following:  Fecal Occult Bld POSITIVE (*)    All other components within normal limits  BASIC METABOLIC PANEL - Abnormal; Notable for the following:    Potassium 5.5 (*)    Chloride 113 (*)    CO2 12 (*)    Glucose, Bld 242 (*)    BUN 202 (*)    Creatinine, Ser 5.11 (*)    Calcium 7.5 (*)    GFR calc non Af Amer 10 (*)    GFR calc Af Amer 12 (*)    Anion gap 18 (*)    All other components within normal limits  LACTIC ACID, PLASMA - Abnormal; Notable for the following:    Lactic Acid, Venous 2.5 (*)    All other components within normal limits  GLUCOSE, CAPILLARY - Abnormal; Notable for the following:    Glucose-Capillary 200 (*)    All other components within normal limits  TROPONIN I - Abnormal; Notable for the following:    Troponin I 0.06 (*)    All other components within normal limits  COOXEMETRY PANEL - Abnormal; Notable for the following:    Total hemoglobin 6.6 (*)    All other components within normal limits  CBC - Abnormal; Notable for the following:    WBC 50.0 (*)    RBC 2.35 (*)    Hemoglobin 7.1 (*)    HCT 21.7 (*)    RDW 15.6 (*)    Platelets 595 (*)    All other components within normal limits  URINALYSIS, MICROSCOPIC (REFLEX) - Abnormal; Notable for the following:    Bacteria, UA MANY (*)    Squamous Epithelial / LPF 0-5 (*)    All other components within normal limits  I-STAT CG4 LACTIC ACID, ED - Abnormal; Notable for the following:    Lactic Acid, Venous 5.31 (*)    All other components within normal limits  I-STAT CG4 LACTIC ACID, ED - Abnormal; Notable for the following:    Lactic Acid, Venous 5.11 (*)    All other components  within normal limits  I-STAT ARTERIAL BLOOD GAS, ED - Abnormal; Notable for the following:    pH, Arterial 7.196 (*)    pCO2 arterial 23.4 (*)    pO2, Arterial 55.0 (*)    Bicarbonate 9.1 (*)    Acid-base deficit 17.0 (*)    All other components within normal limits  I-STAT ARTERIAL BLOOD GAS, ED - Abnormal; Notable for the following:    pH, Arterial 7.196 (*)    pCO2 arterial 23.4 (*)    pO2, Arterial 55.0 (*)    Bicarbonate 9.1 (*)    Acid-base deficit 17.0 (*)    All other components within normal limits  POCT I-STAT 3, ART BLOOD GAS (G3+) - Abnormal; Notable for the following:    pH, Arterial 7.301 (*)    pO2, Arterial 167.0 (*)    Bicarbonate 16.9 (*)    Acid-base deficit 9.0 (*)    All other components within normal limits  POCT I-STAT 3, ART BLOOD GAS (G3+) - Abnormal; Notable for the following:    pH, Arterial 7.059 (*)    pO2, Arterial 124.0 (*)    Bicarbonate 11.1 (*)    Acid-base deficit 18.0 (*)    All other components within normal limits  CULTURE, BLOOD (ROUTINE X 2)  CULTURE, BLOOD (ROUTINE X 2)  CULTURE, RESPIRATORY (NON-EXPECTORATED)  MRSA PCR SCREENING  STREP PNEUMONIAE URINARY ANTIGEN  PROCALCITONIN  CORTISOL  LEGIONELLA PNEUMOPHILA SEROGP 1 UR AG  I-STAT TROPOININ,  ED  TYPE AND SCREEN  ABO/RH  PREPARE RBC (CROSSMATCH)    EKG  EKG Interpretation  Date/Time:  05-11-16 11:55:19 EST Ventricular Rate:  79 PR Interval:    QRS Duration: 107 QT Interval:  395 QTC Calculation: 453 R Axis:   -64 Text Interpretation:  Sinus rhythm Inferior infarct, old Anterior infarct, old No significant change since last tracing Confirmed by [facility]  MD, [place] [phone]) on May 11, 2016 12:39:20 PM       Radiology Dg Chest Portable 1 View  Result Date: 05-11-2016 CLINICAL DATA:  Central line placement EXAM: PORTABLE CHEST 1 VIEW COMPARISON:  Chest x-ray of 05-11-16 earlier today FINDINGS: A right IJ central venous line is present with the tip seen to  the expected SVC -RA junction. No pneumothorax is noted. There is bibasilar opacity left-greater-than-right consistent with atelectasis or possibly pneumonia, particularly at the left lung base the tip of the endotracheal tube is approximately 4.5 cm above the carina. The heart is mildly enlarged and stable. IMPRESSION: 1. Right IJ central venous line tip is seen to the expected SVC -RA junction. 2. Endotracheal tube tip approximately 4.5 cm above the carina. 3. Bibasilar opacities left-greater-than-right. Cannot exclude pneumonia particularly at the left lung base. Electronically Signed   By: Ivar Drape M.D.   On: 2016/05/11 17:12   Dg Chest Port 1 View  Result Date: 05-11-16 CLINICAL DATA:  Intubation. EXAM: PORTABLE CHEST 1 VIEW COMPARISON:  05-11-16.  04/11/2016. FINDINGS: Endotracheal tube tip 4.2 cm above the carina. Cardiomegaly with normal pulmonary vascularity. Left lower lobe atelectasis and infiltrate with small left pleural effusion. IMPRESSION: 1. Endotracheal tube noted in good anatomic position. 2. Left lower lobe atelectasis and infiltrate with left-sided pleural effusion. Electronically Signed   By: Marcello Moores  Register   On: 2016-05-11 15:35   Dg Chest Portable 1 View  Result Date: May 11, 2016 CLINICAL DATA:  Altered mental status, history of chronic kidney disease EXAM: PORTABLE CHEST 1 VIEW COMPARISON:  Chest x-ray of 04/11/2016 FINDINGS: There is slight elevation of the left hemidiaphragm with minimal haziness at the left lung base which may indicate mild atelectasis. Developing pneumonia cannot be excluded and followup two-view chest x-ray is recommended if symptoms persist or worsen. The right lung is clear. Mild cardiomegaly is stable. Fixation plate along the proximal left humeral neck is unchanged. No acute bony abnormality is seen. IMPRESSION: 1. Probable mild linear atelectasis at the left lung base. Consider followup if symptoms persist or worsen. 2. Stable mild cardiomegaly.  Electronically Signed   By: Ivar Drape M.D.   On: May 11, 2016 12:26    Procedures Procedures (including critical care time)  CRITICAL CARE Performed by: Threasa Beards Total critical care time: 40 minutes Critical care time was exclusive of separately billable procedures and treating other patients. Critical care was necessary to treat or prevent imminent or life-threatening deterioration. Critical care was time spent personally by me on the following activities: development of treatment plan with patient and/or surrogate as well as nursing, discussions with consultants, evaluation of patient's response to treatment, examination of patient, obtaining history from patient or surrogate, ordering and performing treatments and interventions, ordering and review of laboratory studies, ordering and review of radiographic studies, pulse oximetry and re-evaluation of patient's condition. Medications Ordered in ED Medications  sodium chloride 0.9 % bolus 500 mL (0 mLs Intravenous Stopped May 11, 2016 1638)  piperacillin-tazobactam (ZOSYN) IVPB 3.375 g (0 g Intravenous Stopped 2016/05/11 1322)  vancomycin (VANCOCIN) 2,000 mg in sodium chloride 0.9 %  500 mL IVPB (0 mg Intravenous Stopped 05/09/2016 1506)  sodium chloride 0.9 % bolus 1,000 mL (0 mLs Intravenous Stopped May 09, 2016 1322)    And  sodium chloride 0.9 % bolus 1,000 mL (0 mLs Intravenous Stopped 05-09-2016 1637)    And  sodium chloride 0.9 % bolus 1,000 mL (0 mLs Intravenous Stopped 05/09/2016 1637)    And  sodium chloride 0.9 % bolus 500 mL (0 mLs Intravenous Stopped 05-09-16 1440)  sodium bicarbonate 1 mEq/mL injection (  Duplicate [id number])  sodium bicarbonate injection 100 mEq (100 mEq Intravenous Given May 09, 2016 1831)  calcium chloride injection 1 g (1 g Intravenous Given 05/09/16 1831)  dextrose 50 % solution 25 g (25 g Intravenous Given 2016-05-09 1951)  EPINEPHrine (ADRENALIN) 1 MG/10ML injection 1 mg (1 mg Intravenous Given 05/09/16 1950)  calcium  chloride injection 1 g (1 g Intravenous Given 2016-05-09 1951)  EPINEPHrine (ADRENALIN) 1 MG/10ML injection 0.5 mg (0.5 mg Intravenous Given 05/09/16 2020)  insulin aspart (novoLOG) injection 10 Units (10 Units Intravenous Given 2016-05-09 1951)     Initial Impression / Assessment and Plan / ED Course  I have reviewed the triage vital signs and the nursing notes.  Pertinent labs & imaging results that were available during my care of the patient were reviewed by me and considered in my medical decision making (see chart for details).   1:25 PM d/w Dr. [name], PCCM.  He will be down to admit the patient.   I have discussed with wife and son at the bedside about goals of care.  They are not familiar with this and feel very overwhelmed in talking about it. Pt is breathing on his own with [place] O2, but with fluid resuscitation for sepsis and altered mental status he may need intubation- however wife would like more time to think this over.  We will continue to monitor closely.    Pt presenting with hypotension, elevated lactic acid.  Sepsis order set started, 30cc/kg bolus, broad spectrum abx.  PCCM consulted and they will admit the patient.    Final Clinical Impressions(s) / ED Diagnoses   Final diagnoses:  Sepsis, due to unspecified organism ([place])  Elevated lactic acid level  Hypotension, unspecified hypotension type  Leukocytosis, unspecified type    New Prescriptions Discharge Medication List as of 05-09-16 11:52 PM       Alfonzo Beers, MD 04/25/16 [id number]

## 2016-05-09 NOTE — ED Notes (Signed)
Pt indwelling catheter in place prior to arrival is not draining at this time, purulent malodorous drainage noted at urethra. Per CCM, remove foley and have replaced.

## 2016-05-09 NOTE — ED Notes (Signed)
Suctioned patient's vent and saturations 94% at this time.  OG draining brown fluids at this time.

## 2016-05-09 NOTE — Progress Notes (Signed)
[id number] systolic b/p in [id number] on [id number] Vasopressin, and 46mcg levophed. Pt's BMP and CBC resulted. Dr [name] Patient at bedside along with Dr. Elsworth Soho in box. Orders given and carried out to give 1mg  Epi, 10units insulin novolog, D50 and 1g calcium chloride. Pt b/p systolic b/p increased to 90's. 2020 pt's b/p decrease to 60's orders to give 0.5g Epi orders carried out. At 2024 pt PEA, CPR started. Please refer to code sheet.

## 2016-05-09 DEATH — deceased

## 2016-05-15 NOTE — Discharge Summary (Deleted)
  The note originally documented on this encounter has been moved the the encounter in which it belongs.  

## 2016-05-15 NOTE — Discharge Summary (Signed)
NAME:  OSWALD, JUSINO NO.:  [id number]  MEDICAL RECORD NO.:  [id number]  LOCATION:                               FACILITY:  [facility]  PHYSICIAN:  Raylene Miyamoto, MD DATE OF BIRTH:  February 11, 1941  DATE OF ADMISSION:  May 20, 2016 DATE OF DISCHARGE:  05-20-2016                              DISCHARGE SUMMARY   DEATH SUMMARY  A 76 year old male with past medical history of renal insufficiency and recent admission for acute renal failure, was found to be less responsive and hypotensive at SNF after recent discharge.  The patient received fluids by EMS, was hypertensive upon admission, then became hypotensive, was found to have pneumonia, respiratory failure, required intubation.  Emergent bronchoscopy showing occluded trachea and left mainstem suctioned aggressively and successfully bronchoscopically, was treated with antibiotics with vancomycin, Zosyn.  Had a central line placed as well for support of blood pressure with pressors.  Upon admission to the ICU, the patient had continued sudden hypoxemia resulting in cardiac arrest and expired despite aggressive efforts.  FINAL DIAGNOSES UPON DEATH: 1. Acute respiratory failure secondary to pneumonia. 2. Atelectasis and pneumonia. 3. Hypoxia secondary to #1. 4. Non-anion gap metabolic acidosis. 5. Aspiration pneumonia. 6. Hypotension/septic shock. 7. Acute-on-chronic renal failure.     Raylene Miyamoto, MD     DJF/MEDQ  D:  05/14/2016  T:  05/15/2016  Job:  [id number]

## 2016-06-03 ENCOUNTER — Telehealth: Payer: Self-pay | Admitting: Internal Medicine

## 2016-06-03 NOTE — Telephone Encounter (Signed)
Any day of the week after 3 pm [place]

## 2016-06-03 NOTE — Telephone Encounter (Signed)
Spoke with pt's wife, wishes to speak to Dr. [name] directly.  Pt states she has questions about pt's end of life- wants to know what exactly the cause of death was; states all she was told is that he had "degenerative disk disease" and would like some clarifications on what exactly happened with pt.  Pt's wife's callback number is [phone].    Dr. [name] please advise if you can call patient's wife.  Thank you.

## 2016-06-11 NOTE — Telephone Encounter (Signed)
Please see "contact" tab to see Dr. Janit Pagan documentation, will post below. Nothing further needed will sign off.   i called wife and discussed his cause of death, she sounded confused etoh?, she was focused on [place] admission prior to his death at [facility] i refferred her to call [facility] long pt experience    By Raylene Miyamoto, MD

## 2017-06-09 IMAGING — MR MR THORACIC SPINE W/O CM
4 of 11 series · 19 of 48 positions shown · non-contrast
Comparison: Lumbar study same day

CLINICAL DATA: Generalized weakness.  Leukocytosis.  Sepsis.

EXAM:
MRI THORACIC SPINE WITHOUT CONTRAST
TECHNIQUE: Multiplanar, multisequence MR imaging of the thoracic spine was
performed. No intravenous contrast was administered.

[Series 8: T2 post-contrast · sagittal · 3.0mm · 0.62mm/px · 3 of 14 slices shown (1 of 2)]
[im 1/14]
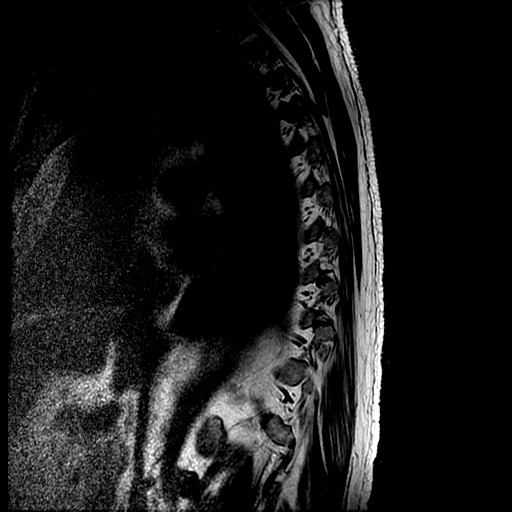
[im 7/14]
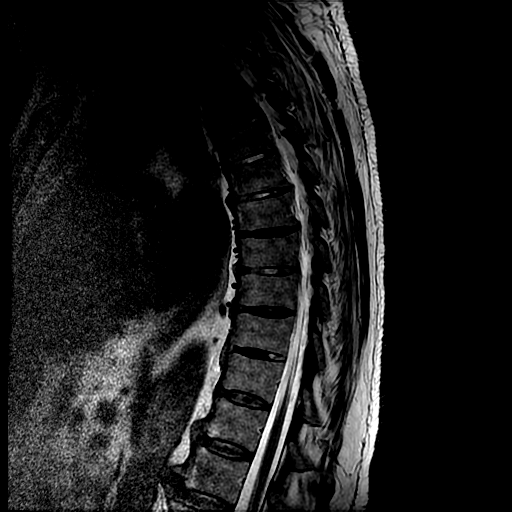
[im 14/14]
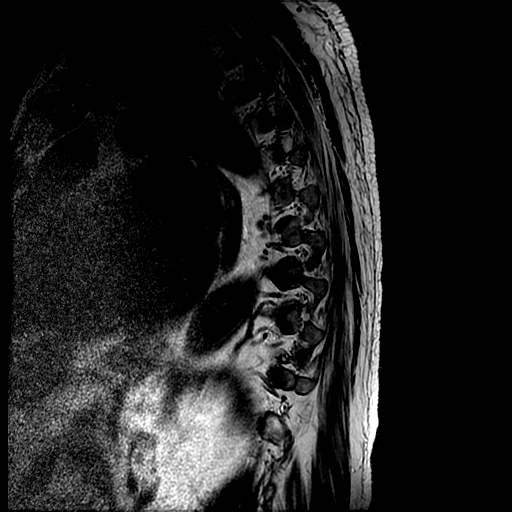

[Series 9: T2 · axial · 4.0mm · 0.41mm/px · z∈[-229,-37]mm · 8 of 43 slices shown (1 of 2)]
[im 1/43]
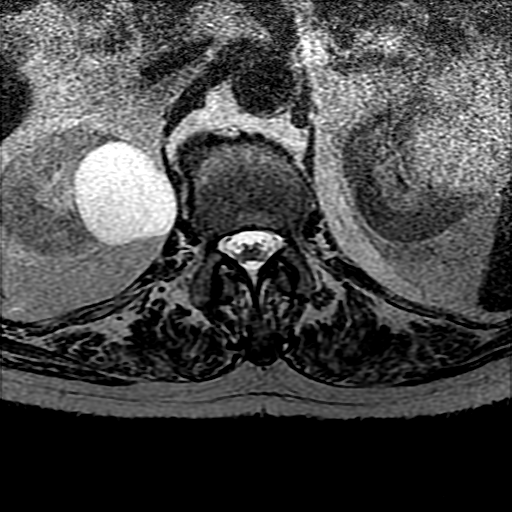
[im 7/43]
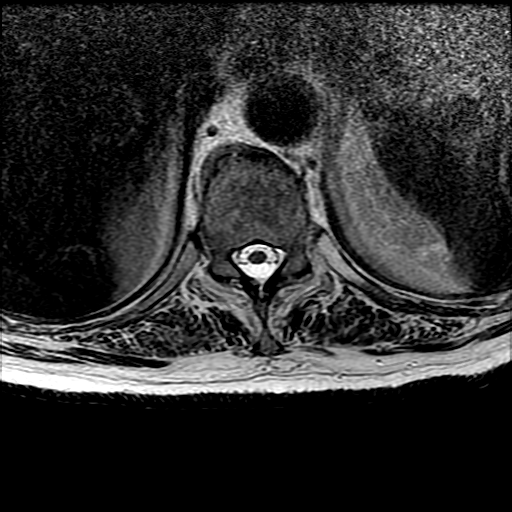
[im 13/43]
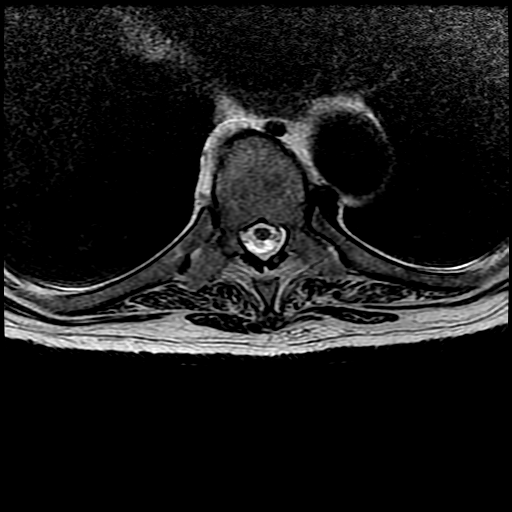
[im 19/43]
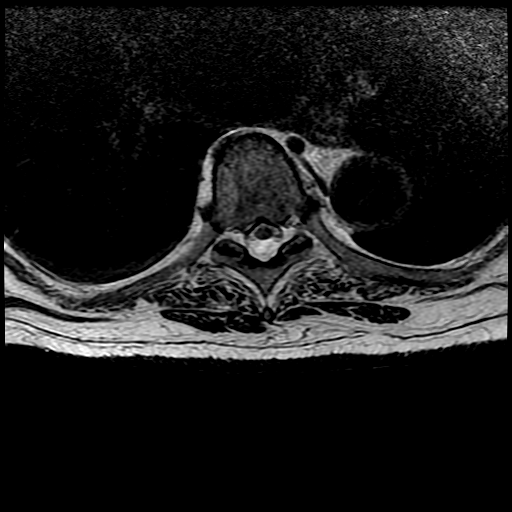
[im 25/43]
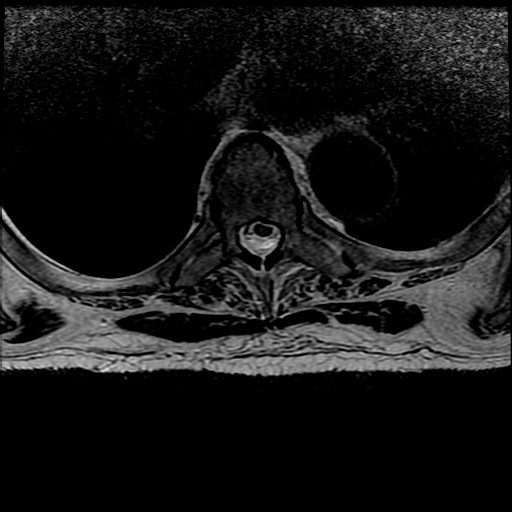
[im 31/43]
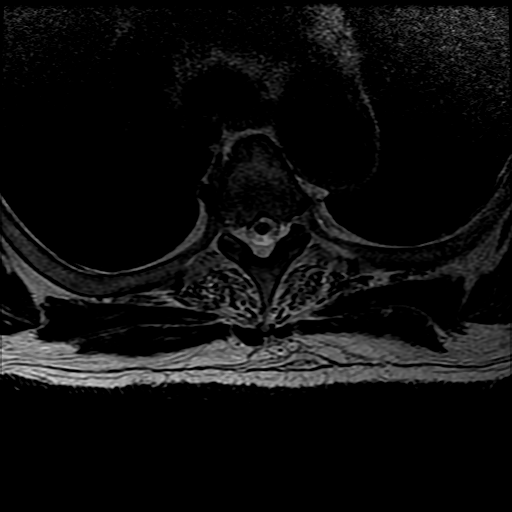
[im 37/43]
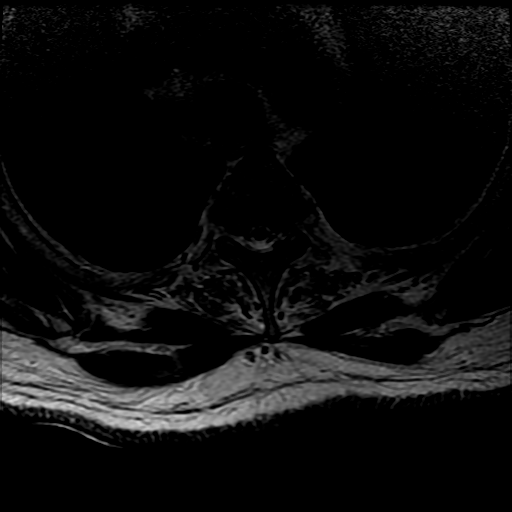
[im 43/43]
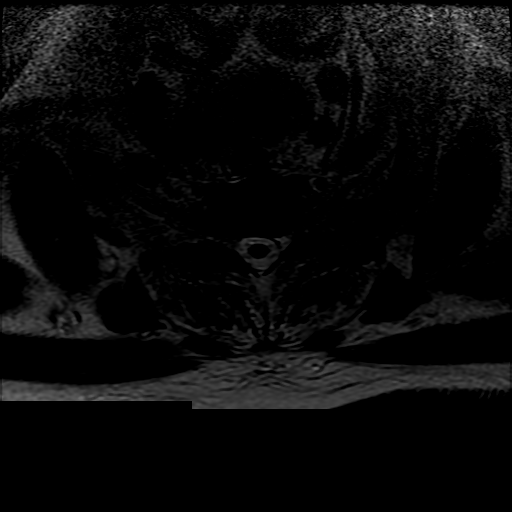

[Series 14: T2 post-contrast · sagittal · 4.0mm · 0.53mm/px · 3 of 14 slices shown (2 of 2)]
[im 1/14]
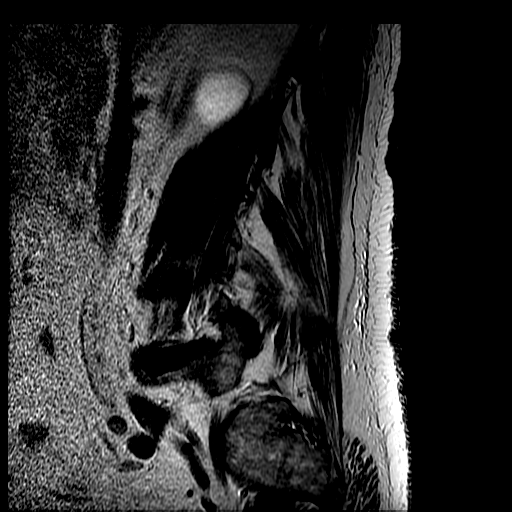
[im 7/14]
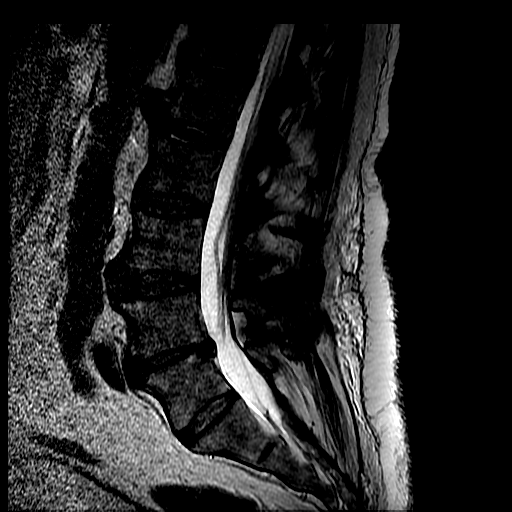
[im 14/14]
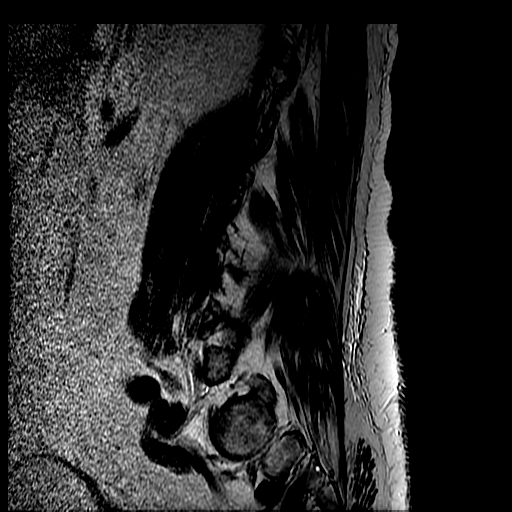

[Series 16: T2 · axial · 4.0mm · 0.41mm/px · z∈[-475,-290]mm · 5 of 37 slices shown (2 of 2)]
[im 1/37]
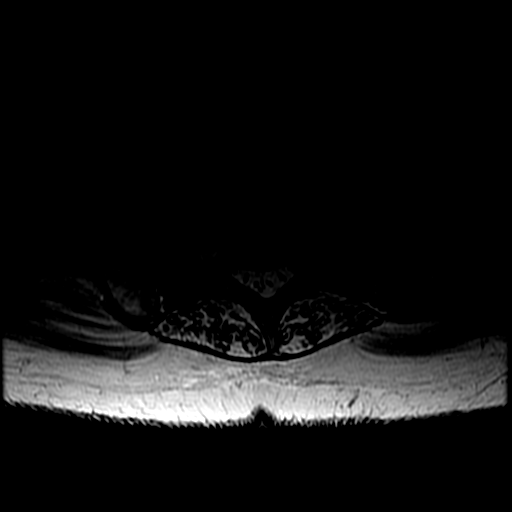
[im 7/37]
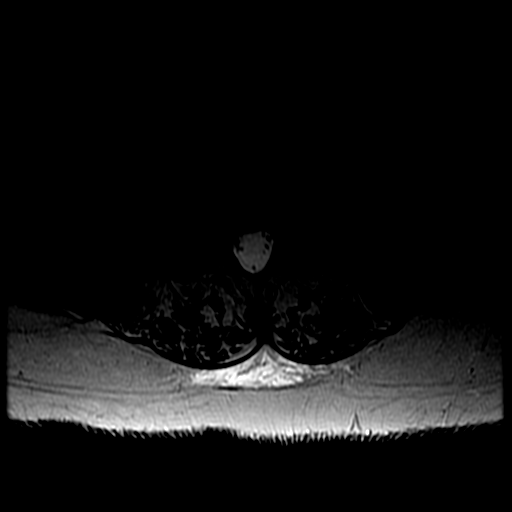
[im 13/37]
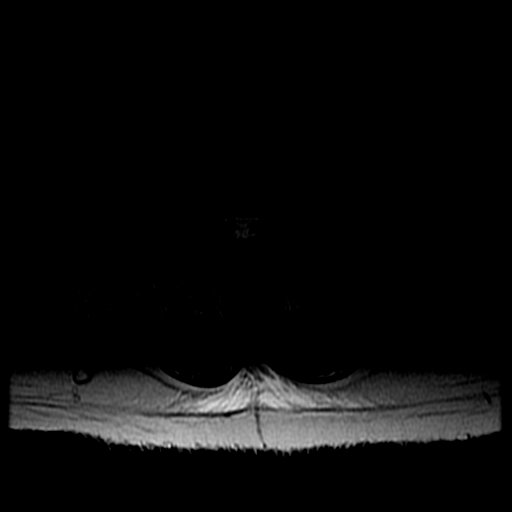
[im 19/37]
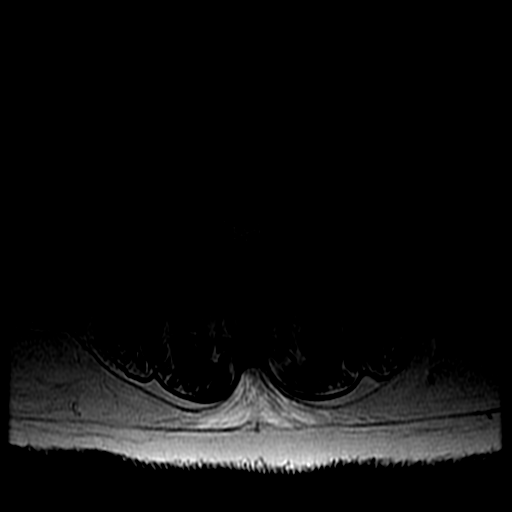
[im 31/37]
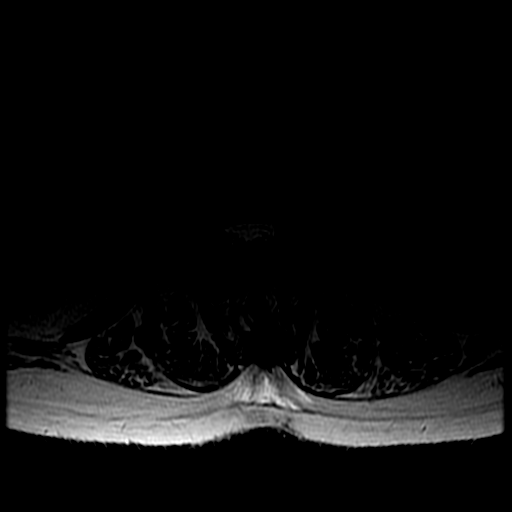

[19 of 48 positions shown; findings below may reference images not displayed]

FINDINGS: Alignment:  Normal

Vertebrae: Gentle kyphotic curvature throughout the thoracic region.
Old minor anterior compression at T2, 20%.

Cord:  No cord compression or primary cord lesion.

Paraspinal and other soft tissues: Negative. Aortic atherosclerosis.

Disc levels:

No disc herniation. Minor chronic disc degenerative changes. No
stenosis of the central canal or neural foramina. Solid bridging
osteophytes throughout the mid and lower thoracic region.
IMPRESSION: No cord lesion. No cord compression. No evidence of spinal
infection.
